# Patient Record
Sex: Male | Born: 1950 | Race: White | Hispanic: No | Marital: Single | Smoking: Current every day smoker
Health system: Southern US, Community
[De-identification: ages and names within clinical notes are randomized; demographics above are authoritative.]

## PROBLEM LIST (undated history)

## (undated) HISTORY — PX: APPENDECTOMY: SHX54

## (undated) HISTORY — PX: JOINT REPLACEMENT: SHX530

## (undated) HISTORY — PX: TOTAL HIP ARTHROPLASTY: SHX124

## (undated) MED FILL — Fosaprepitant Dimeglumine For IV Infusion 150 MG (Base Eq): INTRAVENOUS | Qty: 5 | Status: AC

---

## 2010-02-23 ENCOUNTER — Ambulatory Visit: Payer: Self-pay | Admitting: Internal Medicine

## 2020-06-22 ENCOUNTER — Inpatient Hospital Stay
Admission: EM | Admit: 2020-06-22 | Discharge: 2020-06-24 | DRG: 342 | Disposition: A | Discharge status: Home / Self Care | Payer: Medicare Other | Attending: General Surgery | Admitting: General Surgery

## 2020-06-22 ENCOUNTER — Other Ambulatory Visit: Payer: Self-pay

## 2020-06-22 ENCOUNTER — Ambulatory Visit
Admission: EM | Admit: 2020-06-22 | Discharge: 2020-06-22 | Disposition: A | Discharge status: Other Acute Inpatient Hospital | Payer: Medicare Other

## 2020-06-22 ENCOUNTER — Emergency Department: Payer: Medicare Other

## 2020-06-22 DIAGNOSIS — Z20822 Contact with and (suspected) exposure to covid-19: Secondary | ICD-10-CM | POA: Diagnosis present

## 2020-06-22 DIAGNOSIS — K3580 Unspecified acute appendicitis: Secondary | ICD-10-CM | POA: Diagnosis present

## 2020-06-22 DIAGNOSIS — Z96641 Presence of right artificial hip joint: Secondary | ICD-10-CM | POA: Diagnosis present

## 2020-06-22 DIAGNOSIS — G8929 Other chronic pain: Secondary | ICD-10-CM | POA: Diagnosis not present

## 2020-06-22 DIAGNOSIS — K353 Acute appendicitis with localized peritonitis, without perforation or gangrene: Secondary | ICD-10-CM | POA: Diagnosis not present

## 2020-06-22 DIAGNOSIS — K352 Acute appendicitis with generalized peritonitis, without abscess: Secondary | ICD-10-CM | POA: Diagnosis present

## 2020-06-22 DIAGNOSIS — N390 Urinary tract infection, site not specified: Secondary | ICD-10-CM

## 2020-06-22 DIAGNOSIS — N400 Enlarged prostate without lower urinary tract symptoms: Secondary | ICD-10-CM | POA: Diagnosis present

## 2020-06-22 DIAGNOSIS — R1031 Right lower quadrant pain: Secondary | ICD-10-CM | POA: Diagnosis present

## 2020-06-22 DIAGNOSIS — F17210 Nicotine dependence, cigarettes, uncomplicated: Secondary | ICD-10-CM | POA: Diagnosis present

## 2020-06-22 DIAGNOSIS — N136 Pyonephrosis: Secondary | ICD-10-CM | POA: Diagnosis present

## 2020-06-22 LAB — CBC
HCT: 43.5 % (ref 39.0–52.0)
Hemoglobin: 15.1 g/dL (ref 13.0–17.0)
MCH: 31.3 pg (ref 26.0–34.0)
MCHC: 34.7 g/dL (ref 30.0–36.0)
MCV: 90.2 fL (ref 80.0–100.0)
Platelets: 275 10*3/uL (ref 150–400)
RBC: 4.82 MIL/uL (ref 4.22–5.81)
RDW: 13.8 % (ref 11.5–15.5)
WBC: 16.1 10*3/uL — ABNORMAL HIGH (ref 4.0–10.5)
nRBC: 0 % (ref 0.0–0.2)

## 2020-06-22 LAB — COMPREHENSIVE METABOLIC PANEL
ALT: 18 U/L (ref 0–44)
AST: 19 U/L (ref 15–41)
Albumin: 4.1 g/dL (ref 3.5–5.0)
Alkaline Phosphatase: 54 U/L (ref 38–126)
Anion gap: 11 (ref 5–15)
BUN: 18 mg/dL (ref 8–23)
CO2: 19 mmol/L — ABNORMAL LOW (ref 22–32)
Calcium: 9.4 mg/dL (ref 8.9–10.3)
Chloride: 106 mmol/L (ref 98–111)
Creatinine, Ser: 0.9 mg/dL (ref 0.61–1.24)
GFR, Estimated: >60 mL/min (ref >60)
Glucose, Bld: 135 mg/dL — ABNORMAL HIGH (ref 70–99)
Potassium: 4.2 mmol/L (ref 3.5–5.1)
Sodium: 136 mmol/L (ref 135–145)
Total Bilirubin: 1 mg/dL (ref 0.3–1.2)
Total Protein: 7.4 g/dL (ref 6.5–8.1)

## 2020-06-22 LAB — URINALYSIS, COMPLETE (UACMP) WITH MICROSCOPIC
Bilirubin Urine: NEGATIVE
Glucose, UA: NEGATIVE mg/dL
Ketones, ur: 20 mg/dL — AB
Nitrite: POSITIVE — AB
Protein, ur: NEGATIVE mg/dL
Specific Gravity, Urine: 1.014 (ref 1.005–1.030)
WBC, UA: >50 WBC/hpf — ABNORMAL HIGH (ref 0–5)
pH: 5 (ref 5.0–8.0)

## 2020-06-22 LAB — RESP PANEL BY RT-PCR (FLU A&B, COVID) ARPGX2
Influenza A by PCR: NEGATIVE
Influenza B by PCR: NEGATIVE
SARS Coronavirus 2 by RT PCR: NEGATIVE

## 2020-06-22 LAB — LIPASE, BLOOD: Lipase: 17 U/L (ref 11–51)

## 2020-06-22 MED ORDER — ONDANSETRON HCL 4 MG/2ML IJ SOLN: 4.0000 mg | Freq: Four times a day (QID) | INTRAMUSCULAR | Status: DC | PRN

## 2020-06-22 MED ORDER — HYDROMORPHONE HCL 1 MG/ML IJ SOLN: 0.5000 mg | INTRAMUSCULAR | Status: DC | PRN

## 2020-06-22 MED ORDER — ACETAMINOPHEN 325 MG PO TABS: 650.0000 mg | ORAL_TABLET | Freq: Four times a day (QID) | ORAL | Status: DC | PRN

## 2020-06-22 MED ORDER — SODIUM CHLORIDE 0.9 % IV BOLUS
1000.0000 mL | Freq: Once | INTRAVENOUS | Status: AC
  Administered 2020-06-22: 1000 mL via INTRAVENOUS

## 2020-06-22 MED ORDER — MORPHINE SULFATE (PF) 4 MG/ML IV SOLN
4.0000 mg | Freq: Once | INTRAVENOUS | Status: AC
  Administered 2020-06-22: 4 mg via INTRAVENOUS
  Filled 2020-06-22: qty 1

## 2020-06-22 MED ORDER — PIPERACILLIN-TAZOBACTAM 3.375 G IVPB
3.3750 g | Freq: Three times a day (TID) | INTRAVENOUS | Status: DC
  Administered 2020-06-22 – 2020-06-23 (×3): 3.375 g via INTRAVENOUS
  Filled 2020-06-22 (×2): qty 50

## 2020-06-22 MED ORDER — SODIUM CHLORIDE 0.9 % IV SOLN: Freq: Once | INTRAVENOUS | Status: AC

## 2020-06-22 MED ORDER — DEXTROSE IN LACTATED RINGERS 5 % IV SOLN: INTRAVENOUS | Status: DC

## 2020-06-22 MED ORDER — KETOROLAC TROMETHAMINE 15 MG/ML IJ SOLN
15.0000 mg | Freq: Four times a day (QID) | INTRAMUSCULAR | Status: DC | PRN
  Filled 2020-06-22: qty 1

## 2020-06-22 MED ORDER — ONDANSETRON HCL 4 MG/2ML IJ SOLN
4.0000 mg | Freq: Once | INTRAMUSCULAR | Status: AC
  Administered 2020-06-22: 4 mg via INTRAVENOUS
  Filled 2020-06-22: qty 2

## 2020-06-22 MED ORDER — MUPIROCIN 2 % EX OINT
1.0000 "application " | TOPICAL_OINTMENT | Freq: Two times a day (BID) | CUTANEOUS | Status: DC
  Administered 2020-06-23: 1 via NASAL
  Filled 2020-06-22: qty 22

## 2020-06-22 MED ORDER — SODIUM CHLORIDE 0.9 % IV SOLN
1.0000 g | Freq: Once | INTRAVENOUS | Status: AC
  Administered 2020-06-22: 1 g via INTRAVENOUS
  Filled 2020-06-22: qty 10

## 2020-06-22 NOTE — ED Notes (Signed)
ED Provider at bedside. 

## 2020-06-22 NOTE — ED Notes (Signed)
Patient is being discharged from the Urgent Care and sent to the Emergency Department via POV . Per Becky Augusta NP, patient is in need of higher level of care due to current symptoms. Patient is aware and verbalizes understanding of plan of care.  Vitals:   06/22/20 1247  BP: (!) 166/78  Pulse: (!) 122  Resp: 17  Temp: 99.2 F (37.3 C)  SpO2: 94%

## 2020-06-22 NOTE — ED Notes (Signed)
Last Oral Intake.   Per pt he stated last time he ate or drank anything was yesterday

## 2020-06-22 NOTE — ED Notes (Signed)
MD notified patient complaining of pain 8/10. No pain meds ordered. Awaiting orders.

## 2020-06-22 NOTE — ED Provider Notes (Signed)
[facility] Emergency Department Provider Note    ____________________________________________   I have reviewed the triage vital signs and the nursing notes.   HISTORY  Chief Complaint Abdominal Pain   History limited by: Not Limited   HPI Gary Wilkins is a 70 y.o. male who presents to the emergency department today from urgent care because of concerns for abdominal pain.  The patient states that the pain started early this morning.  It is located in the right lower quadrant.  It has progressively gotten worse.  Patient denies any unusual activity or ingestions yesterday.  Denies similar pain in the past.  He denies any change in urination.  Did think he might of had a brief fever earlier today.  Denies any history of abdominal surgeries.  Records reviewed. Per medical record review patient has a history of right hip arthoplasty.  History reviewed. No pertinent past medical history.   Past Surgical History:  Procedure Laterality Date   TOTAL HIP ARTHROPLASTY Right     Prior to Admission medications   Not on File    Allergies Patient has no known allergies.  Family History  Problem Relation Age of Onset   Alzheimer's disease Mother    Stroke Father     Social History Social History   Tobacco Use   Smoking status: Every Day    Packs/day: 1.00    Pack years: 0.00    Types: Cigarettes   Smokeless tobacco: Never  Vaping Use   Vaping Use: Never used  Substance Use Topics   Alcohol use: Yes    Alcohol/week: 7.0 standard drinks    Types: 7 Shots of liquor per week   Drug use: Not Currently    Review of Systems Constitutional: Positive for brief fever.  Eyes: No visual changes. ENT: No sore throat. Cardiovascular: Denies chest pain. Respiratory: Denies shortness of breath. Gastrointestinal: Positive for abdominal pain.  Genitourinary: Negative for dysuria. Musculoskeletal: Negative for back pain. Skin: Negative for  rash. Neurological: Negative for headaches, focal weakness or numbness.  ____________________________________________   PHYSICAL EXAM:  VITAL SIGNS: ED Triage Vitals  Enc Vitals Group     BP 06/22/20 1341 (!) 166/78     Pulse Rate 06/22/20 1341 (!) 112     Resp 06/22/20 1341 18     Temp 06/22/20 1341 99.3 F (37.4 C)     Temp Source 06/22/20 1341 Oral     SpO2 06/22/20 1341 97 %     Weight 06/22/20 1336 140 lb (63.5 kg)     Height 06/22/20 1336 5\' 10"  (1.778 m)     Head Circumference --      Peak Flow --      Pain Score 06/22/20 1535 1    Constitutional: Alert and oriented.  Eyes: Conjunctivae are normal.  ENT      Head: Normocephalic and atraumatic.      Nose: No congestion/rhinnorhea.      Mouth/Throat: Mucous membranes are moist.      Neck: No stridor. Hematological/Lymphatic/Immunilogical: No cervical lymphadenopathy. Cardiovascular: Normal rate, regular rhythm.  No murmurs, rubs, or gallops.  Respiratory: Normal respiratory effort without tachypnea nor retractions. Breath sounds are clear and equal bilaterally. No wheezes/rales/rhonchi. Gastrointestinal: Soft tender to palpation in the right lower quadrant.  Genitourinary: Deferred Musculoskeletal: Normal range of motion in all extremities. No lower extremity edema. Neurologic:  Normal speech and language. No gross focal neurologic deficits are appreciated.  Skin:  Skin is warm, dry and intact. No rash  noted. Psychiatric: Mood and affect are normal. Speech and behavior are normal. Patient exhibits appropriate insight and judgment.  ____________________________________________    LABS (pertinent positives/negatives)  CMP wnl except co2 19, glu 135 UA cloudy, moderate hgb dipstick, large leukocytes, positive nitrite, 11-20 RBC, >50 WBC, many bacteria Lipase 17 CBC wbc 16.1, hgb 15.1, plt 275  ____________________________________________   EKG  None  ____________________________________________     RADIOLOGY  CT abd/pel Acute appendicitis  ____________________________________________   PROCEDURES  Procedures  ____________________________________________   INITIAL IMPRESSION / ASSESSMENT AND PLAN / ED COURSE  Pertinent labs & imaging results that were available during my care of the patient were reviewed by me and considered in my medical decision making (see chart for details).   Patient presented to the emergency department today with complaints of right lower quadrant abdominal pain.  On exam he is tender in the right lower quadrant.  Work-up does show slight leukocytosis as well as urine consistent with urinary tract infection.  Did obtain CT scan which is consistent with acute appendicitis.  Discussed findings with patient.  Will plan on admission.   ____________________________________________   FINAL CLINICAL IMPRESSION(S) / ED DIAGNOSES  Final diagnoses:  Acute appendicitis, unspecified acute appendicitis type  Lower urinary tract infectious disease     Note: This dictation was prepared with Dragon dictation. Any transcriptional errors that result from this process are unintentional     [name], MD 06/22/20 1659

## 2020-06-22 NOTE — ED Provider Notes (Signed)
[facility]    CSN: [id number] Arrival date & time: 06/22/20  1231      History   Chief Complaint Chief Complaint  Patient presents with   Abdominal Pain    HPI Gary Wilkins is a 70 y.o. male.   HPI  70 year old male here for evaluation of right lower quad abdominal pain.  Patient reports that he had a sudden onset of right lower quadrant abdominal pain that he describes as a dull continuous ache at 0400 this morning.  He initially attributed this to possible constipation but states that did not resolve when he had a bowel movement.  Patient reports that the pain increases with movement, he has had a decreased appetite, and a fever.  He denies nausea vomiting, or blood in his stool.  Patient reports that he had a normal bowel movement which was followed by some diarrhea.  History reviewed. No pertinent past medical history.  There are no problems to display for this patient.   Past Surgical History:  Procedure Laterality Date   TOTAL HIP ARTHROPLASTY Right        Home Medications    Prior to Admission medications   Not on File    Family History Family History  Problem Relation Age of Onset   Alzheimer's disease Mother    Stroke Father     Social History Social History   Tobacco Use   Smoking status: Every Day    Packs/day: 1.00    Pack years: 0.00    Types: Cigarettes   Smokeless tobacco: Never  Vaping Use   Vaping Use: Never used  Substance Use Topics   Alcohol use: Yes    Alcohol/week: 7.0 standard drinks    Types: 7 Shots of liquor per week   Drug use: Not Currently     Allergies   Patient has no known allergies.   Review of Systems Review of Systems  Constitutional:  Positive for appetite change and fever.  Gastrointestinal:  Positive for abdominal pain. Negative for blood in stool, constipation, nausea and vomiting.  Hematological: Negative.   Psychiatric/Behavioral: Negative.      Physical Exam Triage Vital Signs ED  Triage Vitals [06/22/20 1243]  Enc Vitals Group     BP      Pulse      Resp      Temp      Temp src      SpO2      Weight 140 lb (63.5 kg)     Height 5\' 10"  (1.778 m)     Head Circumference      Peak Flow      Pain Score 4     Pain Loc      Pain Edu?      Excl. in GC?    No data found.  Updated Vital Signs BP (!) 166/78 (BP Location: Right Arm)   Pulse (!) 122   Temp 99.2 F (37.3 C) (Oral)   Resp 17   Ht 5\' 10"  (1.778 m)   Wt 140 lb (63.5 kg)   SpO2 94%   BMI 20.09 kg/m   Visual Acuity Right Eye Distance:   Left Eye Distance:   Bilateral Distance:    Right Eye Near:   Left Eye Near:    Bilateral Near:     Physical Exam Vitals and nursing note reviewed.  Constitutional:      General: He is not in acute distress.    Appearance: He is well-developed and  normal weight. He is not ill-appearing.  HENT:     Head: Normocephalic and atraumatic.  Cardiovascular:     Rate and Rhythm: Normal rate and regular rhythm.     Heart sounds: Normal heart sounds. No murmur heard.   No gallop.  Pulmonary:     Effort: Pulmonary effort is normal.     Breath sounds: Normal breath sounds. No wheezing, rhonchi or rales.  Abdominal:     General: Abdomen is flat. Bowel sounds are normal. There is no distension.     Palpations: Abdomen is soft.     Tenderness: There is abdominal tenderness in the right lower quadrant. There is guarding. Positive signs include McBurney's sign.  Skin:    General: Skin is warm.     Capillary Refill: Capillary refill takes less than 2 seconds.     Findings: No erythema or rash.  Neurological:     General: No focal deficit present.     Mental Status: He is alert and oriented to person, place, and time.  Psychiatric:        Mood and Affect: Mood normal.        Behavior: Behavior normal.     UC Treatments / Results  Labs (all labs ordered are listed, but only abnormal results are displayed) Labs Reviewed - No data to  display  EKG   Radiology No results found.  Procedures Procedures (including critical care time)  Medications Ordered in UC Medications - No data to display  Initial Impression / Assessment and Plan / UC Course  I have reviewed the triage vital signs and the nursing notes.  Pertinent labs & imaging results that were available during my care of the patient were reviewed by me and considered in my medical decision making (see chart for details).  Patient is a very pleasant, nontoxic-appearing 70 year old male who looks to be in pain here for evaluation of right lower quadrant pain as described in the HPI above.  Patient's physical exam reveals a benign cardiopulmonary exam.  Abdomen is flat, with positive bowel sounds in all 4 quadrants.  There is guarding in the abdomen.  Patient has tenderness in the right lower quadrant with focal tenderness over McBurney's point.  No rebound.  Abdominal guarding with right lower quadrant pain over majorities point that is in conjunction with fever and anorexia is concerning for appendicitis.  Discussed with patient that he needs to be evaluated in the emergency department and he is elected to go to [facility] via POV for eval of his right lower quadrant abdominal pain.   Final Clinical Impressions(s) / UC Diagnoses   Final diagnoses:  Abdominal pain, chronic, right lower quadrant     Discharge Instructions      Please go to the emergency department at [facility] ER for evaluation of a right lower quadrant abdominal pain as I am concerned that she may have appendicitis.     ED Prescriptions   None    PDMP not reviewed this encounter.   Becky Augusta, NP 06/22/20 1301

## 2020-06-22 NOTE — ED Notes (Signed)
Patient transported to CT 

## 2020-06-22 NOTE — ED Notes (Signed)
Patient transported to Ultrasound 

## 2020-06-22 NOTE — ED Triage Notes (Signed)
Patient complains of right lower quadrant pain with sudden onset around 3am. States that that walking or movement makes the pain worse.

## 2020-06-22 NOTE — ED Triage Notes (Signed)
Pt sent from the [facility] urgent care with RLQ pain that started this morning. Pt is ambulatory with no distress noted at this time

## 2020-06-22 NOTE — Discharge Instructions (Addendum)
Please go to the emergency department at [facility] ER for evaluation of a right lower quadrant abdominal pain as I am concerned that she may have appendicitis.

## 2020-06-23 ENCOUNTER — Inpatient Hospital Stay: Payer: Medicare Other | Admitting: Anesthesiology

## 2020-06-23 ENCOUNTER — Encounter
Admission: EM | Disposition: A | Discharge status: Home / Self Care | Payer: Self-pay | Source: Home / Self Care | Attending: General Surgery

## 2020-06-23 ENCOUNTER — Encounter: Payer: Self-pay | Admitting: General Surgery

## 2020-06-23 DIAGNOSIS — K3580 Unspecified acute appendicitis: Secondary | ICD-10-CM

## 2020-06-23 DIAGNOSIS — K353 Acute appendicitis with localized peritonitis, without perforation or gangrene: Secondary | ICD-10-CM

## 2020-06-23 HISTORY — PX: LAPAROSCOPIC APPENDECTOMY: SHX408

## 2020-06-23 LAB — SURGICAL PCR SCREEN
MRSA, PCR: NEGATIVE
Staphylococcus aureus: POSITIVE — AB

## 2020-06-23 SURGERY — APPENDECTOMY, LAPAROSCOPIC
Anesthesia: General

## 2020-06-23 MED ORDER — SULFAMETHOXAZOLE-TRIMETHOPRIM 800-160 MG PO TABS
1.0000 | ORAL_TABLET | Freq: Two times a day (BID) | ORAL | Status: DC
  Administered 2020-06-23 – 2020-06-24 (×2): 1 via ORAL
  Filled 2020-06-23 (×3): qty 1

## 2020-06-23 MED ORDER — MIDAZOLAM HCL 2 MG/2ML IJ SOLN
INTRAMUSCULAR | Status: AC
  Filled 2020-06-23: qty 2

## 2020-06-23 MED ORDER — PROPOFOL 10 MG/ML IV BOLUS
INTRAVENOUS | Status: AC
  Filled 2020-06-23: qty 20

## 2020-06-23 MED ORDER — LACTATED RINGERS IV SOLN: INTRAVENOUS | Status: DC

## 2020-06-23 MED ORDER — LACTATED RINGERS IV SOLN: INTRAVENOUS | Status: DC | PRN

## 2020-06-23 MED ORDER — FENTANYL CITRATE (PF) 100 MCG/2ML IJ SOLN
INTRAMUSCULAR | Status: AC
  Filled 2020-06-23: qty 2

## 2020-06-23 MED ORDER — HYDROCODONE-ACETAMINOPHEN 7.5-325 MG PO TABS: 1.0000 | ORAL_TABLET | Freq: Once | ORAL | Status: DC | PRN

## 2020-06-23 MED ORDER — PIPERACILLIN-TAZOBACTAM 3.375 G IVPB
INTRAVENOUS | Status: AC
  Filled 2020-06-23: qty 50

## 2020-06-23 MED ORDER — ONDANSETRON HCL 4 MG/2ML IJ SOLN
INTRAMUSCULAR | Status: DC | PRN
  Administered 2020-06-23: 4 mg via INTRAVENOUS

## 2020-06-23 MED ORDER — FENTANYL CITRATE (PF) 100 MCG/2ML IJ SOLN: 25.0000 ug | INTRAMUSCULAR | Status: DC | PRN

## 2020-06-23 MED ORDER — SUCCINYLCHOLINE CHLORIDE 200 MG/10ML IV SOSY
PREFILLED_SYRINGE | INTRAVENOUS | Status: AC
  Filled 2020-06-23: qty 10

## 2020-06-23 MED ORDER — BUPIVACAINE HCL (PF) 0.25 % IJ SOLN
INTRAMUSCULAR | Status: AC
  Filled 2020-06-23: qty 30

## 2020-06-23 MED ORDER — MEPERIDINE HCL 25 MG/ML IJ SOLN: 6.2500 mg | INTRAMUSCULAR | Status: DC | PRN

## 2020-06-23 MED ORDER — OXYCODONE HCL 5 MG PO TABS
5.0000 mg | ORAL_TABLET | ORAL | Status: DC | PRN
Start: 2020-06-23 — End: 2020-06-24

## 2020-06-23 MED ORDER — ROCURONIUM BROMIDE 10 MG/ML (PF) SYRINGE
PREFILLED_SYRINGE | INTRAVENOUS | Status: AC
  Filled 2020-06-23: qty 10

## 2020-06-23 MED ORDER — DEXAMETHASONE SODIUM PHOSPHATE 10 MG/ML IJ SOLN
INTRAMUSCULAR | Status: DC | PRN
  Administered 2020-06-23: 10 mg via INTRAVENOUS

## 2020-06-23 MED ORDER — LIDOCAINE-EPINEPHRINE 1 %-1:100000 IJ SOLN
INTRAMUSCULAR | Status: AC
  Filled 2020-06-23: qty 1

## 2020-06-23 MED ORDER — DEXAMETHASONE SODIUM PHOSPHATE 10 MG/ML IJ SOLN
INTRAMUSCULAR | Status: AC
  Filled 2020-06-23: qty 1

## 2020-06-23 MED ORDER — PROPOFOL 10 MG/ML IV BOLUS
INTRAVENOUS | Status: DC | PRN
  Administered 2020-06-23: 160 mg via INTRAVENOUS

## 2020-06-23 MED ORDER — ACETAMINOPHEN 10 MG/ML IV SOLN
INTRAVENOUS | Status: DC | PRN
  Administered 2020-06-23: 1000 mg via INTRAVENOUS

## 2020-06-23 MED ORDER — MIDAZOLAM HCL 2 MG/2ML IJ SOLN
INTRAMUSCULAR | Status: DC | PRN
  Administered 2020-06-23: 2 mg via INTRAVENOUS

## 2020-06-23 MED ORDER — ONDANSETRON HCL 4 MG/2ML IJ SOLN: 4.0000 mg | Freq: Once | INTRAMUSCULAR | Status: AC | PRN

## 2020-06-23 MED ORDER — ONDANSETRON HCL 4 MG/2ML IJ SOLN
INTRAMUSCULAR | Status: AC
  Administered 2020-06-23: 4 mg via INTRAVENOUS
  Filled 2020-06-23: qty 2

## 2020-06-23 MED ORDER — FENTANYL CITRATE (PF) 100 MCG/2ML IJ SOLN
INTRAMUSCULAR | Status: DC | PRN
  Administered 2020-06-23: 25 ug via INTRAVENOUS
  Administered 2020-06-23: 100 ug via INTRAVENOUS
  Administered 2020-06-23: 50 ug via INTRAVENOUS

## 2020-06-23 MED ORDER — SUCCINYLCHOLINE CHLORIDE 20 MG/ML IJ SOLN
INTRAMUSCULAR | Status: DC | PRN
  Administered 2020-06-23: 120 mg via INTRAVENOUS

## 2020-06-23 MED ORDER — LIDOCAINE HCL (CARDIAC) PF 100 MG/5ML IV SOSY
PREFILLED_SYRINGE | INTRAVENOUS | Status: DC | PRN
  Administered 2020-06-23: 100 mg via INTRAVENOUS

## 2020-06-23 MED ORDER — SUGAMMADEX SODIUM 200 MG/2ML IV SOLN
INTRAVENOUS | Status: DC | PRN
  Administered 2020-06-23 (×3): 50 mg via INTRAVENOUS

## 2020-06-23 MED ORDER — ROCURONIUM BROMIDE 100 MG/10ML IV SOLN
INTRAVENOUS | Status: DC | PRN
  Administered 2020-06-23: 30 mg via INTRAVENOUS

## 2020-06-23 MED ORDER — LIDOCAINE-EPINEPHRINE 1 %-1:100000 IJ SOLN
INTRAMUSCULAR | Status: DC | PRN
  Administered 2020-06-23: 15 mL via INTRAMUSCULAR

## 2020-06-23 MED ORDER — ONDANSETRON HCL 4 MG/2ML IJ SOLN
INTRAMUSCULAR | Status: AC
  Filled 2020-06-23: qty 2

## 2020-06-23 MED ORDER — ACETAMINOPHEN 10 MG/ML IV SOLN
INTRAVENOUS | Status: AC
  Filled 2020-06-23: qty 100

## 2020-06-23 MED ORDER — PHENYLEPHRINE HCL (PRESSORS) 10 MG/ML IV SOLN
INTRAVENOUS | Status: DC | PRN
  Administered 2020-06-23: 100 ug via INTRAVENOUS
  Administered 2020-06-23: 50 ug via INTRAVENOUS

## 2020-06-23 SURGICAL SUPPLY — 53 items
APPLICATOR COTTON TIP 6 STRL (MISCELLANEOUS) ×1 IMPLANT
APPLICATOR COTTON TIP 6IN STRL (MISCELLANEOUS) ×2
APPLIER CLIP 5 13 M/L LIGAMAX5 (MISCELLANEOUS) ×2
BLADE CLIPPER SURG (BLADE) ×2 IMPLANT
BLADE SURG SZ11 CARB STEEL (BLADE) ×2 IMPLANT
CANISTER SUCT 1200ML W/VALVE (MISCELLANEOUS) ×2 IMPLANT
CHLORAPREP W/TINT 26 (MISCELLANEOUS) ×2 IMPLANT
CLIP APPLIE 5 13 M/L LIGAMAX5 (MISCELLANEOUS) ×1 IMPLANT
COVER WAND RF STERILE (DRAPES) ×2 IMPLANT
CUTTER FLEX LINEAR 45M (STAPLE) ×2 IMPLANT
DEFOGGER SCOPE WARMER CLEARIFY (MISCELLANEOUS) ×2 IMPLANT
DERMABOND ADVANCED (GAUZE/BANDAGES/DRESSINGS) ×1
DERMABOND ADVANCED .7 DNX12 (GAUZE/BANDAGES/DRESSINGS) ×1 IMPLANT
ELECT CAUTERY BLADE TIP 2.5 (TIP) ×2
ELECT REM PT RETURN 9FT ADLT (ELECTROSURGICAL) ×2
ELECTRODE CAUTERY BLDE TIP 2.5 (TIP) ×1 IMPLANT
ELECTRODE REM PT RTRN 9FT ADLT (ELECTROSURGICAL) ×1 IMPLANT
GAUZE 4X4 16PLY RFD (DISPOSABLE) ×2 IMPLANT
GLOVE SURG ENC MOIS LTX SZ6.5 (GLOVE) ×2 IMPLANT
GLOVE SURG UNDER LTX SZ7 (GLOVE) ×2 IMPLANT
GOWN STRL REUS W/ TWL LRG LVL3 (GOWN DISPOSABLE) ×2 IMPLANT
GOWN STRL REUS W/TWL LRG LVL3 (GOWN DISPOSABLE) ×2
GRASPER SUT TROCAR 14GX15 (MISCELLANEOUS) ×2 IMPLANT
IRRIGATION STRYKERFLOW (MISCELLANEOUS) ×1 IMPLANT
IRRIGATOR STRYKERFLOW (MISCELLANEOUS) ×2
IV NS 1000ML (IV SOLUTION) ×1
IV NS 1000ML BAXH (IV SOLUTION) ×1 IMPLANT
KIT TURNOVER KIT A (KITS) ×2 IMPLANT
KITTNER LAPARASCOPIC 5X40 (MISCELLANEOUS) ×2 IMPLANT
LABEL OR SOLS (LABEL) ×2 IMPLANT
MANIFOLD NEPTUNE II (INSTRUMENTS) ×2 IMPLANT
NEEDLE HYPO 22GX1.5 SAFETY (NEEDLE) ×2 IMPLANT
NS IRRIG 500ML POUR BTL (IV SOLUTION) ×2 IMPLANT
PACK LAP CHOLECYSTECTOMY (MISCELLANEOUS) ×2 IMPLANT
PENCIL ELECTRO HAND CTR (MISCELLANEOUS) ×2 IMPLANT
POUCH SPECIMEN RETRIEVAL 10MM (ENDOMECHANICALS) ×2 IMPLANT
RELOAD STAPLE TA45 3.5 REG BLU (ENDOMECHANICALS) ×2 IMPLANT
SCISSORS METZENBAUM CVD 33 (INSTRUMENTS) ×2 IMPLANT
SET TUBE SMOKE EVAC HIGH FLOW (TUBING) ×2 IMPLANT
SHEARS HARMONIC ACE PLUS 36CM (ENDOMECHANICALS) ×2 IMPLANT
SLEEVE ADV FIXATION 5X100MM (TROCAR) ×2 IMPLANT
STRIP CLOSURE SKIN 1/2X4 (GAUZE/BANDAGES/DRESSINGS) ×2 IMPLANT
SUT MNCRL 4-0 (SUTURE) ×1
SUT MNCRL 4-0 27XMFL (SUTURE) ×1
SUT VIC AB 3-0 SH 27 (SUTURE) ×1
SUT VIC AB 3-0 SH 27X BRD (SUTURE) ×1 IMPLANT
SUT VICRYL 0 AB UR-6 (SUTURE) ×2 IMPLANT
SUTURE MNCRL 4-0 27XMF (SUTURE) ×1 IMPLANT
SYS KII FIOS ACCESS ABD 5X100 (TROCAR) ×2
SYSTEM KII FIOS ACES ABD 5X100 (TROCAR) ×1 IMPLANT
TRAY FOLEY MTR SLVR 16FR STAT (SET/KITS/TRAYS/PACK) ×2 IMPLANT
TROCAR ADV FIXATION 12X100MM (TROCAR) IMPLANT
TROCAR BALLN GELPORT 12X130M (ENDOMECHANICALS) ×2 IMPLANT

## 2020-06-23 NOTE — Anesthesia Procedure Notes (Signed)
Procedure Name: Intubation Date/Time: 06/23/2020 2:44 PM Performed by: Renee Ramus, CRNA Pre-anesthesia Checklist: Patient identified, Emergency Drugs available, Suction available and Patient being monitored Patient Re-evaluated:Patient Re-evaluated prior to induction Oxygen Delivery Method: Circle system utilized Preoxygenation: Pre-oxygenation with 100% oxygen Induction Type: IV induction Ventilation: Mask ventilation without difficulty Laryngoscope Size: McGraph and 3 Grade View: Grade I Tube type: Oral Tube size: 7.5 mm Number of attempts: 1 Airway Equipment and Method: Stylet and Oral airway Placement Confirmation: ETT inserted through vocal cords under direct vision, positive ETCO2 and breath sounds checked- equal and bilateral Secured at: 22 cm Tube secured with: Tape Dental Injury: Teeth and Oropharynx as per pre-operative assessment

## 2020-06-23 NOTE — Transfer of Care (Signed)
Immediate Anesthesia Transfer of Care Note  Patient: Gary Wilkins  Procedure(s) Performed: APPENDECTOMY LAPAROSCOPIC  Patient Location: PACU  Anesthesia Type:General  Level of Consciousness: drowsy  Airway & Oxygen Therapy: Patient Spontanous Breathing and Patient connected to face mask oxygen  Post-op Assessment: Report given to RN, Post -op Vital signs reviewed and stable and Patient moving all extremities  Post vital signs: Reviewed and stable  Last Vitals:  Vitals Value Taken Time  BP 148/58 06/23/20 1610  Temp 36.3 C 06/23/20 1610  Pulse 91 06/23/20 1611  Resp 21 06/23/20 1611  SpO2 100 % 06/23/20 1611  Vitals shown include unvalidated device data.  Last Pain:  Vitals:   06/23/20 1610  TempSrc: Tympanic  PainSc: 0-No pain         Complications: No notable events documented.

## 2020-06-23 NOTE — H&P (Addendum)
[facility] SURGICAL HISTORY & PHYSICAL (cpt [id number])  HISTORY OF PRESENT ILLNESS (HPI):  70 y.o. male presented to [facility] ED yesterday for abdominal pain. Patient reports the sudden onset of abdominal pain around 0400 the day of presentation. This was located in his RLQ. He initially thought this was constipation, but the pain was not relieved with having a BM. The pain persisted throughout the day without relief. He reports associated nausea. No fever, chills, cough, CP, SOB, emesis, urinary changes, or bowel changes. No history of similar. No previous intra-abdominal surgery. He initially presented to UC for this but was referred to the ED for work up. Work up in the ED revealed a leukocytosis to 16.1K but was otherwise reassuring. CT Abdomen/Pelvis was concerning for acute uncomplicated appendicitis.   General surgery is consulted by emergency medicine physician Dr Olga Coaster, MD for evaluation and management of acute uncomplicated appendicitis   PAST MEDICAL HISTORY (PMH):  History reviewed. No pertinent past medical history.  Reviewed. Otherwise negative.   PAST SURGICAL HISTORY (PSH):  Past Surgical History:  Procedure Laterality Date   TOTAL HIP ARTHROPLASTY Right     Reviewed. Otherwise negative.   MEDICATIONS:  Prior to Admission medications   Not on File     ALLERGIES:  No Known Allergies   SOCIAL HISTORY:  Social History   Socioeconomic History   Marital status: Single    Spouse name: Not on file   Number of children: Not on file   Years of education: Not on file   Highest education level: Not on file  Occupational History   Not on file  Tobacco Use   Smoking status: Every Day    Packs/day: 1.00    Pack years: 0.00    Types: Cigarettes   Smokeless tobacco: Never  Vaping Use   Vaping Use: Never used  Substance and Sexual Activity   Alcohol use: Yes    Alcohol/week: 5.0 standard drinks    Types: 5 Shots of liquor per week   Drug use: Not  Currently   Sexual activity: Not on file  Other Topics Concern   Not on file  Social History Narrative   Not on file   Social Determinants of Health   Financial Resource Strain: Not on file  Food Insecurity: Not on file  Transportation Needs: Not on file  Physical Activity: Not on file  Stress: Not on file  Social Connections: Not on file  Intimate Partner Violence: Not on file     FAMILY HISTORY:  Family History  Problem Relation Age of Onset   Alzheimer's disease Mother    Stroke Father     Otherwise negative.   REVIEW OF SYSTEMS:  Review of Systems  Constitutional:  Negative for chills and fever.  HENT:  Negative for congestion and sore throat.   Respiratory:  Negative for cough and shortness of breath.   Cardiovascular:  Negative for chest pain and palpitations.  Gastrointestinal:  Positive for abdominal pain and nausea. Negative for constipation, diarrhea and vomiting.  Genitourinary:  Negative for dysuria and urgency.  All other systems reviewed and are negative.  VITAL SIGNS:  Temp:  [98 F (36.7 C)-99.5 F (37.5 C)] 99.5 F (37.5 C) (06/10 0516) Pulse Rate:  [80-122] 86 (06/10 0516) Resp:  [16-19] 18 (06/10 0516) BP: (125-175)/(61-101) 125/61 (06/10 0516) SpO2:  [93 %-100 %] 93 % (06/10 0516) Weight:  [63.5 kg] 63.5 kg (06/09 1336)     Height: 5\' 10"  (177.8 cm) Weight: 63.5  kg BMI (Calculated): 20.09   PHYSICAL EXAM:  Physical Exam Vitals and nursing note reviewed. Exam conducted with a chaperone present.  Constitutional:      General: He is not in acute distress.    Appearance: He is well-developed and normal weight. He is not ill-appearing.  HENT:     Head: Normocephalic and atraumatic.  Eyes:     General: No scleral icterus.    Extraocular Movements: Extraocular movements intact.  Cardiovascular:     Rate and Rhythm: Normal rate.     Heart sounds: Normal heart sounds.  Pulmonary:     Effort: Pulmonary effort is normal. No respiratory distress.   Abdominal:     General: Abdomen is flat. There is no distension.     Palpations: Abdomen is soft.     Tenderness: There is abdominal tenderness in the right lower quadrant. There is no guarding or rebound. Positive signs include Rovsing's sign and McBurney's sign.  Genitourinary:    Comments: Deferred  Skin:    General: Skin is warm and dry.     Coloration: Skin is not jaundiced or pale.  Neurological:     General: No focal deficit present.     Mental Status: He is alert and oriented to person, place, and time.  Psychiatric:        Mood and Affect: Mood normal.        Behavior: Behavior normal.    INTAKE/OUTPUT:  This shift: No intake/output data recorded.  Last 2 shifts: @IOLAST2SHIFTS @  Labs:  CBC Latest Ref Rng & Units 06/22/2020  WBC 4.0 - 10.5 K/uL 16.1(H)  Hemoglobin 13.0 - 17.0 g/dL 08/22/2020  Hematocrit 11.9 - 52.0 % 43.5  Platelets 150 - 400 K/uL 275   CMP Latest Ref Rng & Units 06/22/2020  Glucose 70 - 99 mg/dL 08/22/2020)  BUN 8 - 23 mg/dL 18  Creatinine 829(F - 6.21 mg/dL 3.08  Sodium 6.57 - 846 mmol/L 136  Potassium 3.5 - 5.1 mmol/L 4.2  Chloride 98 - 111 mmol/L 106  CO2 22 - 32 mmol/L 19(L)  Calcium 8.9 - 10.3 mg/dL 9.4  Total Protein 6.5 - 8.1 g/dL 7.4  Total Bilirubin 0.3 - 1.2 mg/dL 1.0  Alkaline Phos 38 - 126 U/L 54  AST 15 - 41 U/L 19  ALT 0 - 44 U/L 18    Imaging studies:   CT Abdomen/Pelvis (06/22/2020) personally reviewed showing stranding surrounding the appendix without abscess or free air, and radiologist report reviewed:  IMPRESSION: Findings consistent with acute appendicitis. No definite abscess is noted.   Right nephrolithiasis is noted with moderate right hydronephrosis, but no ureteral dilatation or obstructing calculus is noted. 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   Mild prostatic enlargement.   Aortic Atherosclerosis (ICD10-I70.0).   Assessment/Plan: (ICD-10's: K35.80) 70 y.o. male with abdominal pain and leukocytosis  found to have acute uncomplicated appendicitis.    - Admit to general surgery  - Will plan on laparoscopic appendectomy this afternoon with Dr 62 pending OR/Anesthesia availability   - All risks, benefits, and alternatives to above procedure(s) were discussed with the patient, all of his questions were answered to his expressed satisfaction, patient expresses he wishes to proceed, and informed consent was obtained.   - NPO + IVF Resuscitation - IV Abx (Zosyn) - Monitor abdominal examination; on-going bowel function   - Pain control prn; emetics prn   - DVT prophylaxis  All of the above findings and recommendations were discussed with the patient and all  of his questions were answered to his expressed satisfaction.  -- Lynden Oxford, PA-C [facility] 06/23/2020, 7:09 AM [phone] M-F: 7am - 4pm  I saw and evaluated the patient.  I agree with the above documentation, exam, and plan, which I have edited where appropriate. Duanne Guess  1:14 PM

## 2020-06-23 NOTE — Discharge Instructions (Signed)
In addition to included general post-operative instructions,  Diet: Resume home diet.   Activity: No heavy lifting >20 pounds (children, pets, laundry, garbage) for 4 weeks, but light activity and walking are encouraged. Do not drive or drink alcohol if taking narcotic pain medications or having pain that might distract from driving.  Wound care: 2 days after surgery (06/12), you may shower/get incision wet with soapy water and pat dry (do not rub incisions), but no baths or submerging incision underwater until follow-up.   Medications: Resume all home medications. For mild to moderate pain: acetaminophen (Tylenol) or ibuprofen/naproxen (if no kidney disease). Combining Tylenol with alcohol can substantially increase your risk of causing liver disease. Narcotic pain medications, if prescribed, can be used for severe pain, though may cause nausea, constipation, and drowsiness. Do not combine Tylenol and Percocet (or similar) within a 6 hour period as Percocet (and similar) contain(s) Tylenol. If you do not need the narcotic pain medication, you do not need to fill the prescription.  Call office [phone] / [phone]) at any time if any questions, worsening pain, fevers/chills, bleeding, drainage from incision site, or other concerns.

## 2020-06-23 NOTE — Op Note (Signed)
Operative Note  Laparoscopic Appendectomy   [name] Date of operation:  06/23/2020  Indications: The patient presented with a history of  abdominal pain. Workup has revealed findings consistent with acute appendicitis.  Pre-operative Diagnosis: Acute appendicitis with generalized peritonitis  Post-operative Diagnosis: Same  Surgeon: Duanne Guess, MD  Anesthesia: GETA  Findings: Inflamed appendix with generalized peritonitis, no perforation or abscess  Estimated Blood Loss: <5cc         Specimens: appendix         Complications:  none immediately apparent.  Procedure Details  The patient was seen again in the preop area. The options of surgery versus observation were reviewed with the patient and/or family. The risks of bleeding, infection, recurrence of symptoms, negative laparoscopy, potential for an open procedure, bowel injury, abscess or infection, were all reviewed as well. The patient was taken to Operating Room, identified as Gary Wilkins and the procedure verified as laparoscopic appendectomy. A time out was performed and the above information confirmed.  The patient was placed in the supine position and general anesthesia was induced.  Antibiotic prophylaxis was administered and VTE prophylaxis was in place. A Foley catheter was placed by the nursing staff.   The abdomen was prepped and draped in a sterile fashion. A supraumbilical incision was made. A cutdown technique was used to enter the abdominal cavity. Two vicryl stitches were placed on the fascia and a Hasson trocar inserted. Pneumoperitoneum obtained. Two 5 mm ports were placed under direct visualization.  The appendix was identified and found to be acutely inflamed and stuck to the terminal ileum.  No abscess or perforation, but the surrounding bowel and peritoneum was red and appeared inflamed. The appendix was carefully dissected away from the surrounding tissues The mesoappendix was divided with the  Harmonic scalpel, with clips applied to the very prominent appendiceal artery. The base of the appendix was dissected out and divided with a standard load Endo GIA.The appendix was placed in a Endo Catch bag and removed via the Hasson port. The right lower quadrant and pelvis was then irrigated with normal saline which was then aspirated. The right lower quadrant was inspected there was no sign of bleeding or bowel injury therefore pneumoperitoneum was released, all ports were removed.  The umbilical fascia was closed with 0 Vicryl interrupted sutures and the skin incisions were approximated with subcuticular 4-0 Monocryl. Dermabond was applied The patient tolerated the procedure well and there were no immediately apparent complications. The sponge lap and needle count were correct at the end of the procedure.  The patient was taken to the recovery room in stable condition to be admitted for continued care.   Duanne Guess, MD, FACS

## 2020-06-23 NOTE — Anesthesia Preprocedure Evaluation (Addendum)
Anesthesia Evaluation  Patient identified by MRN, date of birth, ID band Patient awake    Reviewed: Allergy & Precautions, NPO status , Patient's Chart, lab work & pertinent test results  History of Anesthesia Complications Negative for: history of anesthetic complications  Airway Mallampati: III  TM Distance: >3 FB Neck ROM: Full    Dental no notable dental hx. (+) Poor Dentition, Chipped, Missing   Pulmonary neg pulmonary ROS, Current Smoker,    Pulmonary exam normal breath sounds clear to auscultation       Cardiovascular Exercise Tolerance: Good METS: 3 - Mets negative cardio ROS Normal cardiovascular exam Rhythm:Regular Rate:Normal     Neuro/Psych negative neurological ROS  negative psych ROS   GI/Hepatic negative GI ROS, Neg liver ROS,   Endo/Other  negative endocrine ROS  Renal/GU negative Renal ROS  negative genitourinary   Musculoskeletal negative musculoskeletal ROS (+)   Abdominal   Peds  Hematology negative hematology ROS (+)   Anesthesia Other Findings   Reproductive/Obstetrics negative OB ROS                            Anesthesia Physical Anesthesia Plan  ASA: 2  Anesthesia Plan: General   Post-op Pain Management:    Induction: Intravenous  PONV Risk Score and Plan: Ondansetron  Airway Management Planned: Oral ETT  Additional Equipment:   Intra-op Plan:   Post-operative Plan: Extubation in OR  Informed Consent: I have reviewed the patients History and Physical, chart, labs and discussed the procedure including the risks, benefits and alternatives for the proposed anesthesia with the patient or authorized representative who has indicated his/her understanding and acceptance.     Dental advisory given  Plan Discussed with: CRNA  Anesthesia Plan Comments: (Benefits and risk discussed with patient to include death, MI and CVA.  Pt wishes to proceed.)        Anesthesia Quick Evaluation

## 2020-06-23 NOTE — Anesthesia Postprocedure Evaluation (Signed)
Anesthesia Post Note  Patient: Dayden Viverette  Procedure(s) Performed: APPENDECTOMY LAPAROSCOPIC  Patient location during evaluation: Endoscopy Anesthesia Type: General Level of consciousness: awake and alert Pain management: pain level controlled Vital Signs Assessment: post-procedure vital signs reviewed and stable Respiratory status: spontaneous breathing, nonlabored ventilation, respiratory function stable and patient connected to nasal cannula oxygen Cardiovascular status: blood pressure returned to baseline and stable Postop Assessment: no apparent nausea or vomiting Anesthetic complications: no   No notable events documented.   Last Vitals:  Vitals:   06/23/20 1630 06/23/20 1645  BP: 130/61 (!) 117/57  Pulse: 92   Resp: 18 16  Temp: (!) 36.3 C (!) 36.4 C  SpO2: 100% 98%    Last Pain:  Vitals:   06/23/20 1700  TempSrc:   PainSc: 0-No pain                 Cleda Mccreedy Emonni Depasquale

## 2020-06-24 ENCOUNTER — Encounter: Payer: Self-pay | Admitting: General Surgery

## 2020-06-24 MED ORDER — IBUPROFEN 800 MG PO TABS: 800.0000 mg | ORAL_TABLET | Freq: Three times a day (TID) | ORAL | 0 refills | Status: DC | PRN

## 2020-06-24 MED ORDER — HYDROCODONE-ACETAMINOPHEN 5-325 MG PO TABS: 1.0000 | ORAL_TABLET | Freq: Four times a day (QID) | ORAL | 0 refills | Status: DC | PRN

## 2020-06-24 NOTE — Progress Notes (Signed)
Mobility Specialist - Progress Note   06/24/20 1200  Mobility  Activity Ambulated in room  Level of Assistance Independent  Assistive Device None  Distance Ambulated (ft) 25 ft  Mobility Ambulated independently in room  Mobility Response Tolerated well  Mobility performed by Mobility specialist  $Mobility charge 1 Mobility    Pt ambulating in room independently. No LOB. No AD in use. Anticipating d/c this date.    Filiberto Pinks Mobility Specialist 06/24/20, 12:44 PM

## 2020-06-24 NOTE — Discharge Summary (Signed)
Physician Discharge Summary  Patient ID: Gary Wilkins MRN: [id number] DOB/AGE: 72-Aug-1952 72 y.o.  Admit date: 06/22/2020 Discharge date: 06/24/2020  Admission Diagnoses: Acute appendicitis.  Discharge Diagnoses:  Active Problems:   Acute appendicitis   Discharged Condition: good  Hospital Course: Underwent surgery, pain controlled, tolerating diet.  Consults: None  Significant Diagnostic Studies: radiology: CT scan: See report.  Treatments: surgery: Laparoscopic appendectomy.  Discharge Exam: Blood pressure 138/73, pulse 79, temperature 98.3 F (36.8 C), temperature source Oral, resp. rate 18, height 5\' 10"  (1.778 m), weight 63.5 kg, SpO2 96 %. GI: soft, non-tender; bowel sounds normal; no masses,  no organomegaly Incision/Wound: Clean, dry and intact.  Steri-Strips intact.  Disposition: Discharge disposition: 01-Home or Self Care      Discharge Instructions     Call MD for:  persistant nausea and vomiting   Complete by: As directed    Call MD for:  redness, tenderness, or signs of infection (pain, swelling, redness, odor or green/yellow discharge around incision site)   Complete by: As directed    Call MD for:  severe uncontrolled pain   Complete by: As directed    Diet - low sodium heart healthy   Complete by: As directed    Discharge wound care:   Complete by: As directed    Your incision was closed with Steri-strips.  It is best to keep it clean and dry, it will tolerate a brief shower, but do not soak it or apply any creams or lotions to the incisions.  The Strips should gradually peel off over time.  Keep it open to air so you can evaluate your incisions.  The strips assist the underlying sutures to keep your incision closed and protected from infection.  Should you develop some drainage from your incision, some drops of drainage would be okay but if it persists continue to put keep a dry dressing over it.   Driving Restrictions   Complete by: As directed    No  driving until cleared after follow-up appointment.  Is not advised to drive while taking narcotic pain medications or in significant pain.   Increase activity slowly   Complete by: As directed    Lifting restrictions   Complete by: As directed    Strongly advised against any form of lifting greater than 15 pounds over the next 4 to 6 weeks.  This involves pushing/pulling movements as well.  After 4 weeks when may gradually engage in more activities remaining aware of any new pain/tenderness elicited, and avoiding those for the full duration of 6 weeks.  Walking is encouraged.  Climbing stairs with caution.      Allergies as of 06/24/2020   No Known Allergies      Medication List     TAKE these medications    HYDROcodone-acetaminophen 5-325 MG tablet Commonly known as: NORCO/VICODIN Take 1 tablet by mouth every 6 (six) hours as needed for moderate pain.   ibuprofen 800 MG tablet Commonly known as: ADVIL Take 1 tablet (800 mg total) by mouth every 8 (eight) hours as needed.               Discharge Care Instructions  (From admission, onward)           Start     Ordered   06/24/20 0000  Discharge wound care:       Comments: Your incision was closed with Steri-strips.  It is best to keep it clean and dry, it will tolerate a brief shower,  but do not soak it or apply any creams or lotions to the incisions.  The Strips should gradually peel off over time.  Keep it open to air so you can evaluate your incisions.  The strips assist the underlying sutures to keep your incision closed and protected from infection.  Should you develop some drainage from your incision, some drops of drainage would be okay but if it persists continue to put keep a dry dressing over it.   06/24/20 1112            Follow-up Information     Donovan Kail, PA-C. Schedule an appointment as soon as possible for a visit in 2 week(s).   Specialty: Physician Assistant Why: s/p laparoscopic  appendectomy Contact information: [address] [place] [address] [phone]                 Signed: Campbell Lerner, M.D., [facility] 06/24/2020, 11:12 AM

## 2020-06-27 LAB — SURGICAL PATHOLOGY

## 2020-07-12 ENCOUNTER — Other Ambulatory Visit: Payer: Self-pay

## 2020-07-12 ENCOUNTER — Ambulatory Visit (INDEPENDENT_AMBULATORY_CARE_PROVIDER_SITE_OTHER): Payer: Medicare Other | Admitting: Physician Assistant

## 2020-07-12 ENCOUNTER — Encounter: Payer: Self-pay | Admitting: Physician Assistant

## 2020-07-12 VITALS — BP 185/78 | HR 79 | Temp 98.6°F | Ht 70.0 in | Wt 133.8 lb

## 2020-07-12 DIAGNOSIS — K353 Acute appendicitis with localized peritonitis, without perforation or gangrene: Secondary | ICD-10-CM

## 2020-07-12 DIAGNOSIS — Z09 Encounter for follow-up examination after completed treatment for conditions other than malignant neoplasm: Secondary | ICD-10-CM

## 2020-07-12 NOTE — Patient Instructions (Addendum)
If you have any concerns or questions, please feel free to call our office.    GENERAL POST-OPERATIVE PATIENT INSTRUCTIONS   WOUND CARE INSTRUCTIONS:  Keep a dry clean dressing on the wound if there is drainage. The initial bandage may be removed after 24 hours.  Once the wound has quit draining you may leave it open to air.  If clothing rubs against the wound or causes irritation and the wound is not draining you may cover it with a dry dressing during the daytime.  Try to keep the wound dry and avoid ointments on the wound unless directed to do so.  If the wound becomes bright red and painful or starts to drain infected material that is not clear, please contact your physician immediately.  If the wound is mildly pink and has a thick firm ridge underneath it, this is normal, and is referred to as a healing ridge.  This will resolve over the next 4-6 weeks.  BATHING: You may shower if you have been informed of this by your surgeon. However, Please do not submerge in a tub, hot tub, or pool until incisions are completely sealed or have been told by your surgeon that you may do so.  DIET:  You may eat any foods that you can tolerate.  It is a good idea to eat a high fiber diet and take in plenty of fluids to prevent constipation.  If you do become constipated you may want to take a mild laxative or take ducolax tablets on a daily basis until your bowel habits are regular.  Constipation can be very uncomfortable, along with straining, after recent surgery.  ACTIVITY:  You are encouraged to cough and deep breath or use your incentive spirometer if you were given one, every 15-30 minutes when awake.  This will help prevent respiratory complications and low grade fevers post-operatively if you had a general anesthetic.  You may want to hug a pillow when coughing and sneezing to add additional support to the surgical area, if you had abdominal or chest surgery, which will decrease pain during these times.   You are encouraged to walk and engage in light activity for the next two weeks.  You should not lift more than 10-15 pounds, until 07/21/2020 as it could put you at increased risk for complications.  Twenty pounds is roughly equivalent to a plastic bag of groceries. At that time- Listen to your body when lifting, if you have pain when lifting, stop and then try again in a few days. Soreness after doing exercises or activities of daily living is normal as you get back in to your normal routine.  MEDICATIONS:  Try to take narcotic medications and anti-inflammatory medications, such as tylenol, ibuprofen, naprosyn, etc., with food.  This will minimize stomach upset from the medication.  Should you develop nausea and vomiting from the pain medication, or develop a rash, please discontinue the medication and contact your physician.  You should not drive, make important decisions, or operate machinery when taking narcotic pain medication.  SUNBLOCK Use sun block to incision area over the next year if this area will be exposed to sun. This helps decrease scarring and will allow you avoid a permanent darkened area over your incision.     Laparoscopic Appendectomy, Adult, Care After This sheet gives you information about how to care for yourself after your procedure. Your doctor may also give you more specific instructions. If youhave problems or questions, contact your doctor. What can  I expect after the procedure? After the procedure, it is common to have: Little energy for normal activities. Mild pain in the area where the cuts from surgery (incisions) were made. Trouble pooping (constipation). This can be caused by: Pain medicine. A lack of activity. Follow these instructions at home: Medicines Take over-the-counter and prescription medicines only as told by your doctor. If you were prescribed an antibiotic medicine, take it as told by your doctor. Do not stop taking it even if you start to feel  better. Do not drive or use heavy machinery while taking prescription pain medicine. Ask your doctor if the medicine you are taking can cause trouble pooping. You may need to take steps to prevent or treat trouble pooping: Drink enough fluid to keep your pee (urine) pale yellow. Take over-the-counter or prescription medicines. Eat foods that are high in fiber. These include beans, whole grains, and fresh fruits and vegetables. Limit foods that are high in fat and sugar. These include fried or sweet foods. Incision care  Follow instructions from your doctor about how to take care of your cuts from surgery. Make sure you: Wash your hands with soap and water before and after you change your bandage (dressing). If you cannot use soap and water, use hand sanitizer. Change your bandage as told by your doctor. Leave stitches (sutures), skin glue, or skin tape (adhesive) strips in place. They may need to stay in place for 2 weeks or longer. If tape strips get loose and curl up, you may trim the loose edges. Do not remove tape strips completely unless your doctor says it is okay. Check your cuts from surgery every day for signs of infection. Check for: Redness, swelling, or pain. Fluid or blood. Warmth. Pus or a bad smell.  Bathing Keep your cuts from surgery clean and dry. Clean them as told by your doctor. To do this: Gently wash the cuts with soap and water. Rinse the cuts with water to remove all soap. Pat the cuts dry with a clean towel. Do not rub the cuts. Do not take baths, swim, or use a hot tub for 2 weeks, or until your doctor says it is okay. You may take showers after 48 hours. Activity  Do not drive for 24 hours if you were given a medicine to help you relax (sedative) during your procedure. Rest after the procedure. Return to your normal activities as told by your doctor. Ask your doctor what activities are safe for you. For 3 weeks, or for as long as told by your doctor: Do not  lift anything that is heavier than 10 lb (4.5 kg), or the limit that you are told. Do not play contact sports.  General instructions If you were sent home with a drain, follow instructions from your doctor on how to care for it. Take deep breaths. This helps to keep your lungs from getting an infection (pneumonia). Keep all follow-up visits as told by your doctor. This is important. Contact a doctor if: You have redness, swelling, or pain around a cut from surgery. You have fluid or blood coming from a cut. Your cut feels warm to the touch. You have pus or a bad smell coming from a cut or a bandage. The edges of a cut break open after the stitches have been taken out. You have pain in your shoulders that gets worse. You feel dizzy or you pass out (faint). You have shortness of breath. You keep feeling sick to your stomach (  nauseous). You keep throwing up (vomiting). You get watery poop (diarrhea) or you cannot control your poop. You lose your appetite. You have swelling or pain in your legs. You get a rash. Get help right away if: You have a fever. You have trouble breathing. You have sharp pains in your chest. Summary After the procedure, it is common to have low energy, mild pain, and trouble pooping. Infection is a common problem after this procedure. Follow your doctor's instructions about caring for yourself after the procedure. Rest after the procedure. Return to your normal activities as told by your doctor. Contact your doctor if you see signs of infection around your cuts from surgery, or you get short of breath. Get help right away if you have a fever, chest pain, or trouble breathing. This information is not intended to replace advice given to you by your health care provider. Make sure you discuss any questions you have with your healthcare provider. Document Revised: 07/03/2017 Document Reviewed: 07/03/2017 Elsevier Patient Education  2022 [employer].

## 2020-07-12 NOTE — Progress Notes (Signed)
[facility] POST-OP OFFICE VISIT  07/12/2020  HPI: Gary Wilkins is a 70 y.o. male 19 days s/p laparoscopic appendectomy for acute appendicitis with Dr Lady Gary   He is doing very well and states "he feels better than even before he had appendicitis." No abdominal pain, fever, chills, nausea, emesis, or bowel changes He initially had decrease in his appetite but this has resolved No issues with incisions; steri-strips have fallen off Ambulating well No other complaints   Vital signs: BP (!) 185/78   Pulse 79   Temp 98.6 F (37 C) (Oral)   Ht 5\' 10"  (1.778 m)   Wt 133 lb 12.8 oz (60.7 kg)   SpO2 97%   BMI 19.20 kg/m    Physical Exam: Constitutional: Well appearing male, NAD Abdomen: Soft, non-tender, non-distended, no rebound.guarding Skin: Laparoscopic incisions are well healed, no erythema or drainage  Assessment/Plan: This is a 70 y.o. male 9 days s/p laparoscopic appendectomy for acute appendicitis   - Pain control prn; OTC medications  - Reviewed wound care  - Reviewed lifting restrictions; 4 weeks total   - Reviewed surgical pathology: appendicitis, negative for malignancy   - He can return to clinic on an as needed basis  -- 60, PA-C [facility] 07/12/2020, 10:40 AM [phone] M-F: 7am - 4pm

## 2020-10-31 ENCOUNTER — Encounter: Payer: Self-pay | Admitting: General Surgery

## 2021-12-23 IMAGING — CT CT ABD-PELV W/O CM
2 of 4 series · 16 of 46 positions shown, 18 images · non-contrast
Comparison: None.

CLINICAL DATA: Acute right lower quadrant abdominal pain.

EXAM:
CT ABDOMEN AND PELVIS WITHOUT CONTRAST
TECHNIQUE: Multidetector CT imaging of the abdomen and pelvis was performed
following the standard protocol without IV contrast.

[Series 2: axial st · axial · 0.78mm/px · z∈[-207,+188]mm · 13 of 87 slices shown, 15 images]
[im 4/87  soft-tissue]
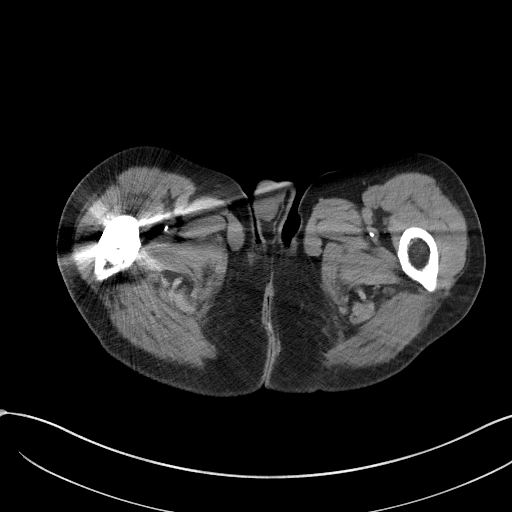
[im 4/87  bone]
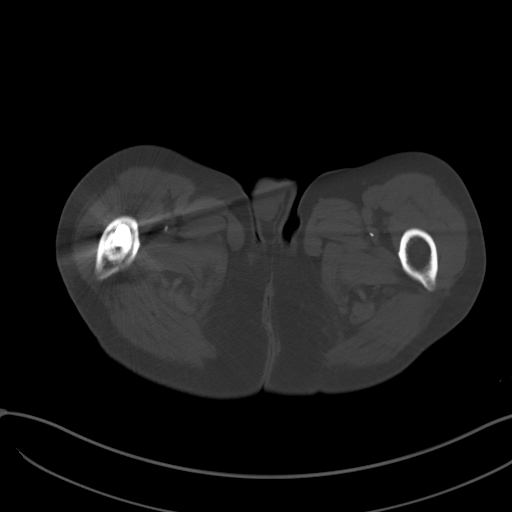
[im 10/87  soft-tissue]
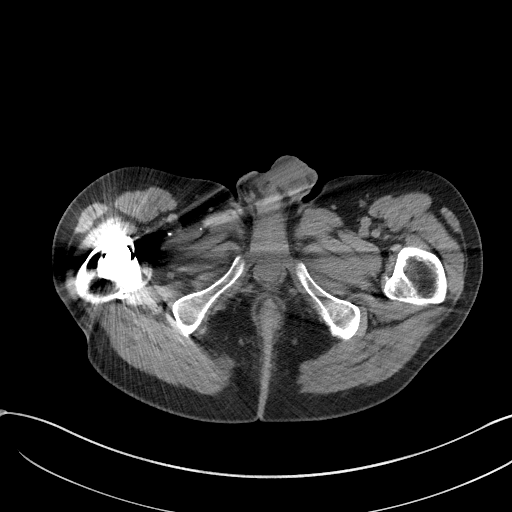
[im 17/87  soft-tissue]
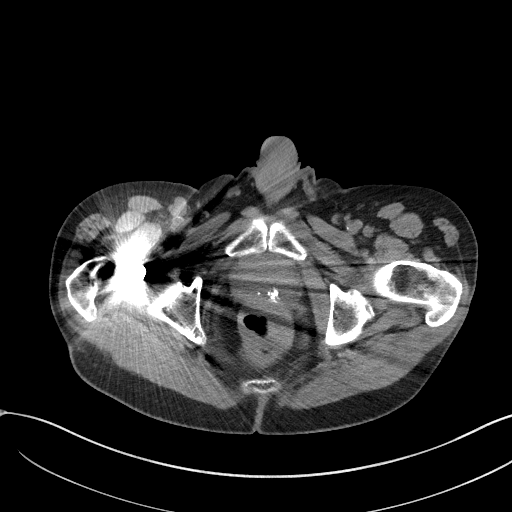
[im 24/87  soft-tissue]
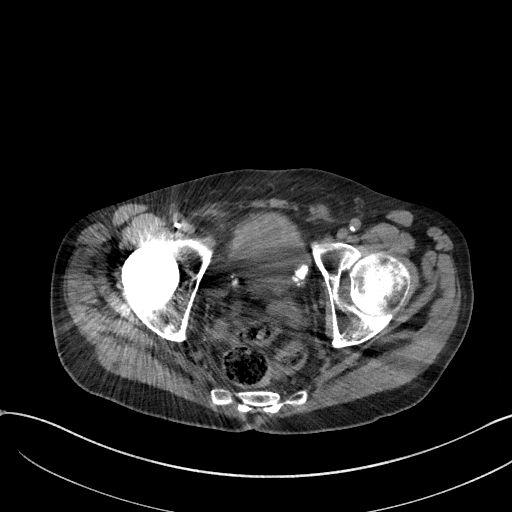
[im 30/87  soft-tissue]
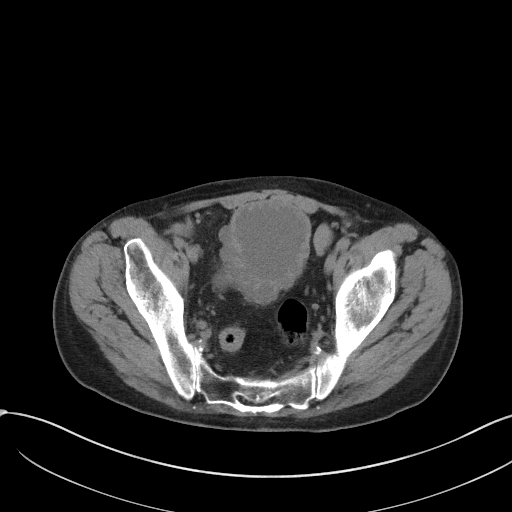
[im 37/87  soft-tissue]
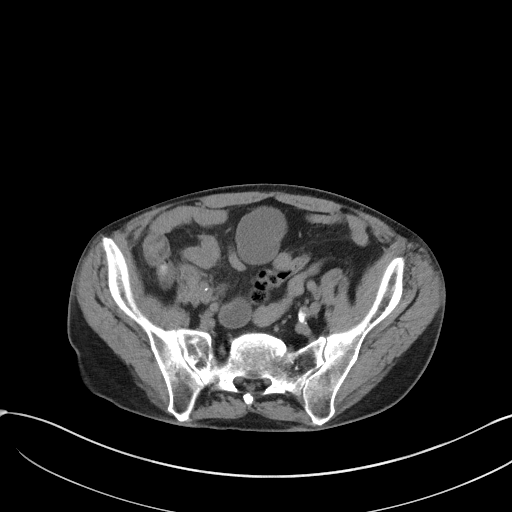
[im 44/87  soft-tissue]
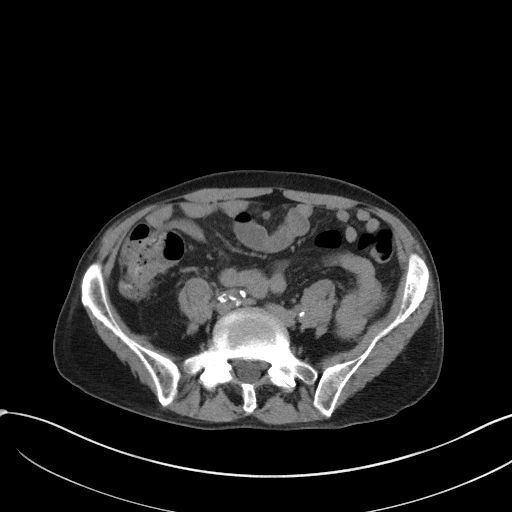
[im 50/87  soft-tissue]
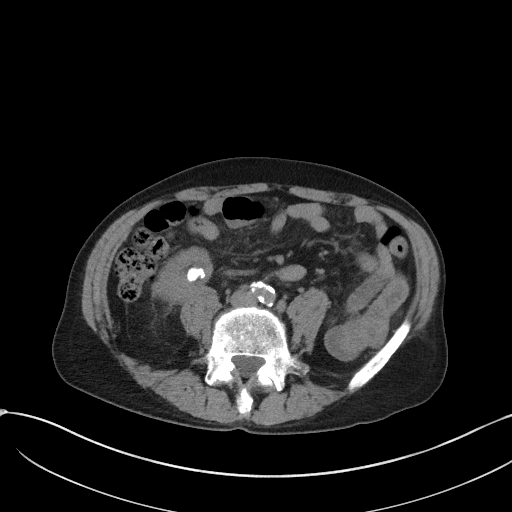
[im 57/87  soft-tissue]
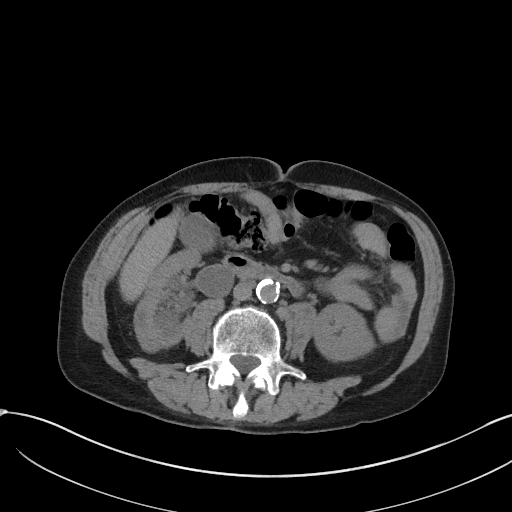
[im 57/87  bone]
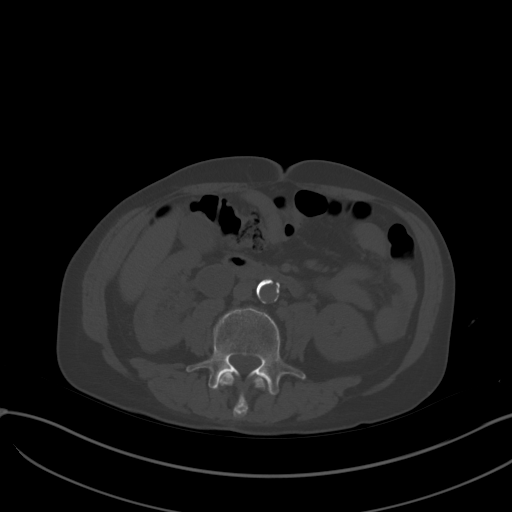
[im 63/87  soft-tissue]
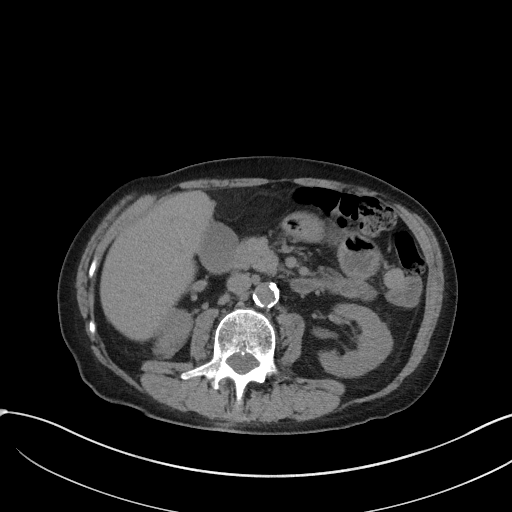
[im 70/87  soft-tissue]
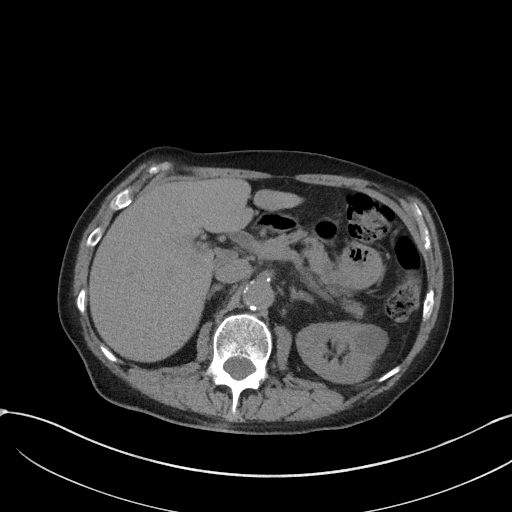
[im 77/87  soft-tissue]
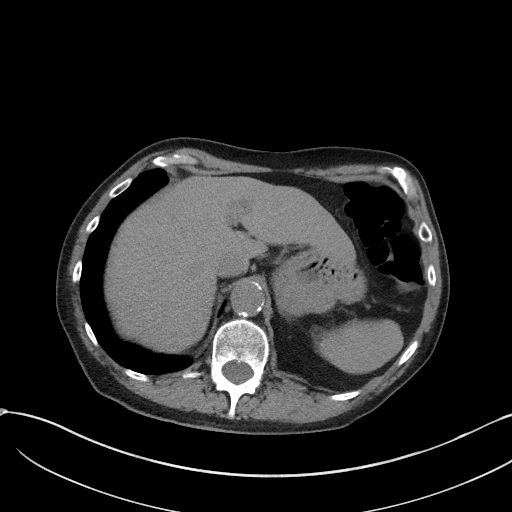
[im 83/87  soft-tissue]
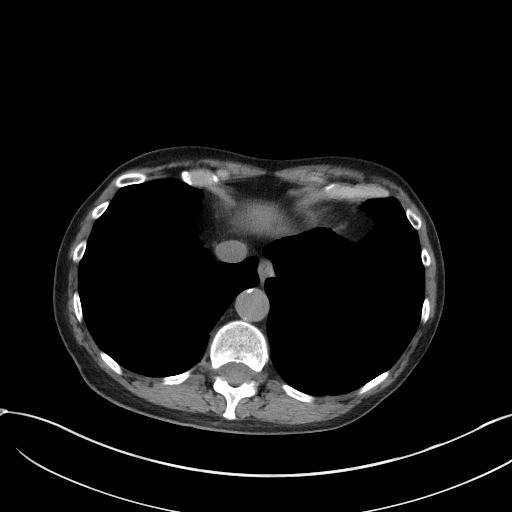

[Series 5: coronal st · coronal · 0.66mm/px · 3 of 73 slices shown]
[im 25/73  soft-tissue]
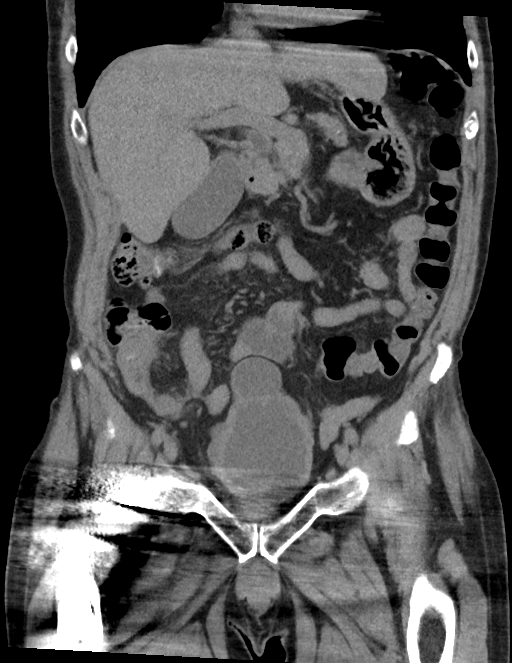
[im 33/73  soft-tissue]
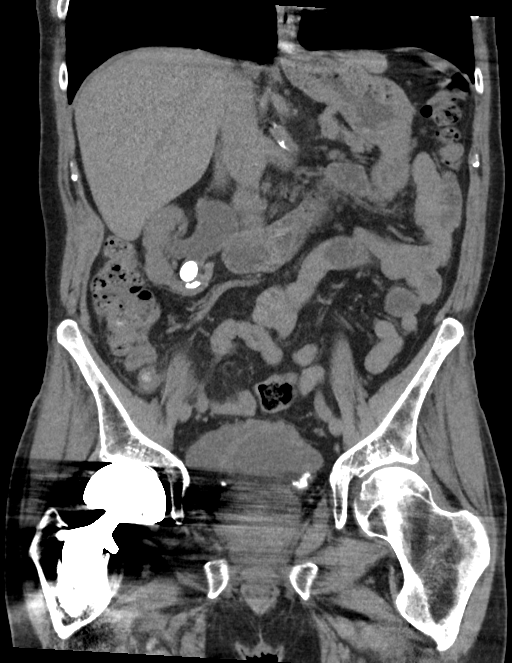
[im 41/73  soft-tissue]
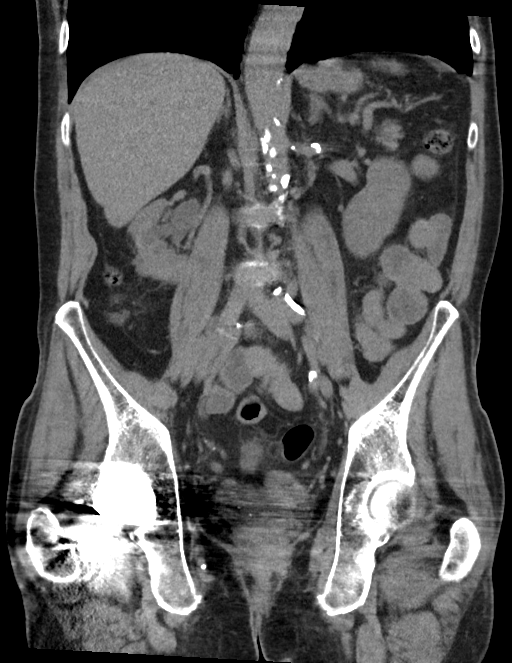

[16 of 46 positions shown; findings below may reference images not displayed]

FINDINGS: Lower chest: No acute abnormality.

Hepatobiliary: No focal liver abnormality is seen. No gallstones,
gallbladder wall thickening, or biliary dilatation.

Pancreas: Unremarkable. No pancreatic ductal dilatation or
surrounding inflammatory changes.

Spleen: Normal in size without focal abnormality.

Adrenals/Urinary Tract: Adrenal glands appear normal. Left renal
cyst is noted. Multiple calculi are noted in the lower pole
collecting system of the right kidney, the largest measuring 16 mm.
Moderate right hydronephrosis is noted without ureteral dilatation
or obstructing calculus, concerning for ureteropelvic junction
stenosis. Multiple bladder diverticula are noted of varying sizes,
some of which contain calcifications.

Stomach/Bowel: The stomach appears normal. There is no evidence of
bowel obstruction. The appendix is enlarged with surrounding
inflammation consistent with appendicitis.

Appendix: Location: Right lower quadrant.

Diameter: 13 mm.

Appendicolith: Yes.

Mucosal hyper-enhancement: Unenhanced exam.

Extraluminal gas: No.

Periappendiceal collection: No.

Vascular/Lymphatic: Aortic atherosclerosis. No enlarged abdominal or
pelvic lymph nodes.

Reproductive: Mild prostatic enlargement is noted.

Other: No abdominal wall hernia or abnormality. No abdominopelvic
ascites.

Musculoskeletal: Status post right total hip arthroplasty. No acute
osseous abnormality is noted.
IMPRESSION: Findings consistent with acute appendicitis. No definite abscess is
noted.

Right nephrolithiasis is noted with moderate right hydronephrosis,
but no ureteral dilatation or obstructing calculus is noted. This is
concerning for ureteropelvic junction stenosis.

Mild prostatic enlargement.

Aortic Atherosclerosis (GIVBE-GII.I).

## 2022-05-04 ENCOUNTER — Ambulatory Visit
Admission: EM | Admit: 2022-05-04 | Discharge: 2022-05-04 | Disposition: A | Discharge status: Home / Self Care | Payer: Medicare Other

## 2022-05-04 DIAGNOSIS — R221 Localized swelling, mass and lump, neck: Secondary | ICD-10-CM

## 2022-05-04 NOTE — ED Notes (Signed)
Patient is being discharged from the Urgent Care and sent to the Emergency Department via private vehicle . Per Cheri Rous, NP, patient is in need of higher level of care due to Neck Mass and needs further imaging. Patient is aware and verbalizes understanding of plan of care.  Vitals:   05/04/22 1115  BP: (!) 155/76  Pulse: 93  Resp: 16  Temp: 98.5 F (36.9 C)  SpO2: 95%

## 2022-05-04 NOTE — ED Provider Notes (Signed)
[facility]    CSN: [id number] Arrival date & time: 05/04/22  1109      History   Chief Complaint Chief Complaint  Patient presents with   Mass    HPI Gary Wilkins is a 72 y.o. male presents for eval ration of a neck mass.  Patient reports 1 week of a nonpainful itchy mass to the right side of his neck that has been increasing in size.  He denies any redness or warmth, fevers or chills.  No injury to the area.  Does have some pain when he turns his head to the left.  He denies any difficulty swallowing or breathing.  States he has a mole to his right shoulder that has been changing recently.  States that is also itchy.  Reports he had similar symptoms in the past on his left side of his neck that resolved without treatment. He has not used any OTC medications for symptoms. No other concerns at this time.   HPI  History reviewed. No pertinent past medical history.  Patient Active Problem List   Diagnosis Date Noted   Acute appendicitis 06/22/2020    Past Surgical History:  Procedure Laterality Date   LAPAROSCOPIC APPENDECTOMY N/A 06/23/2020   Procedure: APPENDECTOMY LAPAROSCOPIC;  Surgeon: Duanne Guess, MD;  Location: [facility] ORS;  Service: General;  Laterality: N/A;   TOTAL HIP ARTHROPLASTY Right        Home Medications    Prior to Admission medications   Not on File    Family History Family History  Problem Relation Age of Onset   Alzheimer's disease Mother    Stroke Father     Social History Social History   Tobacco Use   Smoking status: Every Day    Packs/day: 1    Types: Cigarettes   Smokeless tobacco: Never  Vaping Use   Vaping Use: Never used  Substance Use Topics   Alcohol use: Yes    Alcohol/week: 5.0 standard drinks of alcohol    Types: 5 Shots of liquor per week   Drug use: Not Currently     Allergies   Patient has no known allergies.   Review of Systems Review of Systems  HENT:         Mass to right neck       Physical Exam Triage Vital Signs ED Triage Vitals  Enc Vitals Group     BP 05/04/22 1115 (!) 155/76     Pulse Rate 05/04/22 1115 93     Resp 05/04/22 1115 16     Temp 05/04/22 1115 98.5 F (36.9 C)     Temp Source 05/04/22 1115 Oral     SpO2 05/04/22 1115 95 %     Weight 05/04/22 1115 140 lb (63.5 kg)     Height 05/04/22 1115  (1.753 m)     Head Circumference --      Peak Flow --      Pain Score 05/04/22 1117 0     Pain Loc --      Pain Edu? --      Excl. in GC? --    No data found.  Updated Vital Signs BP (!) 155/76 (BP Location: Left Arm)   Pulse 93   Temp 98.5 F (36.9 C) (Oral)   Resp 16   Ht  (1.753 m)   Wt 140 lb (63.5 kg)   SpO2 95%   BMI 20.67 kg/m   Visual Acuity Right Eye Distance:  Left Eye Distance:   Bilateral Distance:    Right Eye Near:   Left Eye Near:    Bilateral Near:     Physical Exam Vitals and nursing note reviewed.  Constitutional:      Appearance: Normal appearance.  HENT:     Head: Normocephalic and atraumatic.      Mouth/Throat:     Tongue: No lesions. Tongue does not deviate from midline.     Pharynx: Oropharynx is clear. Uvula midline. No uvula swelling.     Comments: Extensive dental caries with missing teeth.  No swelling of the tongue, gums Eyes:     Pupils: Pupils are equal, round, and reactive to light.  Cardiovascular:     Rate and Rhythm: Normal rate.  Pulmonary:     Effort: Pulmonary effort is normal.  Musculoskeletal:     Cervical back: Normal range of motion. No rigidity. Normal range of motion.  Skin:    General: Skin is warm and dry.  Neurological:     General: No focal deficit present.     Mental Status: He is alert and oriented to person, place, and time.  Psychiatric:        Behavior: Behavior normal.      UC Treatments / Results  Labs (all labs ordered are listed, but only abnormal results are displayed) Labs Reviewed - No data to display  EKG   Radiology No results  found.  Procedures Procedures (including critical care time)  Medications Ordered in UC Medications - No data to display  Initial Impression / Assessment and Plan / UC Course  I have reviewed the triage vital signs and the nursing notes.  Pertinent labs & imaging results that were available during my care of the patient were reviewed by me and considered in my medical decision making (see chart for details).     Discussed symptoms and exam with patient.  Discussed limitations and abilities of urgent care.  Advised to go to the emergency room for further evaluation and workup of his neck mass.  He states he is in agreement with plan and will go POV to the emergency room. Final Clinical Impressions(s) / UC Diagnoses   Final diagnoses:  Mass of right side of neck     Discharge Instructions      Please go to the ER for any worsening symptoms    ED Prescriptions   None    PDMP not reviewed this encounter.   Radford Pax, NP 05/04/22 1137

## 2022-05-04 NOTE — Discharge Instructions (Addendum)
Please go to the ER for any worsening symptoms

## 2022-05-04 NOTE — ED Triage Notes (Signed)
Pt c/o mass on R side of neck x1 wk. Hx of mass in neck in the past, denies any pain when swallowing, states area is itchy.

## 2022-05-05 ENCOUNTER — Emergency Department: Payer: Medicare Other

## 2022-05-05 ENCOUNTER — Emergency Department
Admission: EM | Admit: 2022-05-05 | Discharge: 2022-05-05 | Disposition: A | Discharge status: Home / Self Care | Payer: Medicare Other | Attending: Emergency Medicine | Admitting: Emergency Medicine

## 2022-05-05 ENCOUNTER — Other Ambulatory Visit: Payer: Self-pay

## 2022-05-05 DIAGNOSIS — R221 Localized swelling, mass and lump, neck: Secondary | ICD-10-CM | POA: Insufficient documentation

## 2022-05-05 LAB — COMPREHENSIVE METABOLIC PANEL
ALT: 14 U/L (ref 0–44)
AST: 19 U/L (ref 15–41)
Albumin: 3.6 g/dL (ref 3.5–5.0)
Alkaline Phosphatase: 66 U/L (ref 38–126)
Anion gap: 11 (ref 5–15)
BUN: 23 mg/dL (ref 8–23)
CO2: 23 mmol/L (ref 22–32)
Calcium: 9.5 mg/dL (ref 8.9–10.3)
Chloride: 105 mmol/L (ref 98–111)
Creatinine, Ser: 0.84 mg/dL (ref 0.61–1.24)
GFR, Estimated: >60 mL/min (ref >60)
Glucose, Bld: 142 mg/dL — ABNORMAL HIGH (ref 70–99)
Potassium: 4.1 mmol/L (ref 3.5–5.1)
Sodium: 139 mmol/L (ref 135–145)
Total Bilirubin: 0.5 mg/dL (ref 0.3–1.2)
Total Protein: 7.2 g/dL (ref 6.5–8.1)

## 2022-05-05 LAB — CBC WITH DIFFERENTIAL/PLATELET
Abs Immature Granulocytes: 0.09 10*3/uL — ABNORMAL HIGH (ref 0.00–0.07)
Basophils Absolute: 0.1 10*3/uL (ref 0.0–0.1)
Basophils Relative: 1 %
Eosinophils Absolute: 0.1 10*3/uL (ref 0.0–0.5)
Eosinophils Relative: 1 %
HCT: 43.9 % (ref 39.0–52.0)
Hemoglobin: 14.5 g/dL (ref 13.0–17.0)
Immature Granulocytes: 1 %
Lymphocytes Relative: 18 %
Lymphs Abs: 2.1 10*3/uL (ref 0.7–4.0)
MCH: 31.1 pg (ref 26.0–34.0)
MCHC: 33 g/dL (ref 30.0–36.0)
MCV: 94.2 fL (ref 80.0–100.0)
Monocytes Absolute: 1 10*3/uL (ref 0.1–1.0)
Monocytes Relative: 8 %
Neutro Abs: 8.6 10*3/uL — ABNORMAL HIGH (ref 1.7–7.7)
Neutrophils Relative %: 71 %
Platelets: 371 10*3/uL (ref 150–400)
RBC: 4.66 MIL/uL (ref 4.22–5.81)
RDW: 13.4 % (ref 11.5–15.5)
WBC: 12 10*3/uL — ABNORMAL HIGH (ref 4.0–10.5)
nRBC: 0 % (ref 0.0–0.2)

## 2022-05-05 MED ORDER — AMOXICILLIN-POT CLAVULANATE 875-125 MG PO TABS: 1.0000 | ORAL_TABLET | Freq: Two times a day (BID) | ORAL | 0 refills | Status: DC

## 2022-05-05 MED ORDER — IOHEXOL 300 MG/ML  SOLN
75.0000 mL | Freq: Once | INTRAMUSCULAR | Status: AC | PRN
  Administered 2022-05-05: 75 mL via INTRAVENOUS

## 2022-05-05 MED ORDER — PREDNISONE 10 MG (48) PO TBPK: ORAL_TABLET | ORAL | 0 refills | Status: DC

## 2022-05-05 NOTE — ED Triage Notes (Signed)
Pt reports about a week ago he noticed a lump on the right side of his neck. Pt reports the lump has continued to grow in size and itches. Pt reports has hx of eczema so is not sure if the itching is coming from the lump or his eczema. Pt reports hx of lumps on his neck in the past and one was an infected lymph node and one was a clogged salivary gland. Pt denies fever, sob or other sx;s.

## 2022-05-05 NOTE — Discharge Instructions (Signed)
You have a mass on the right side of your neck.  There are concerns that this is a neoplasm which is considered cancerous.  However you will need to be seen by ENT.  Take the antibiotic as prescribed.  Take steroids as prescribed. When you call the ENT office, tell them we spoke with Dr. Jenne Campus today and that he wants you to be seen as soon as possible. Return emergency department if you are worsening Return if you have any headaches or dizziness as this may be pressing on your jugular vein.

## 2022-05-05 NOTE — ED Provider Notes (Signed)
[facility] Provider Note    None    (approximate)   History   Mass   HPI  Gary Wilkins is a 72 y.o. male with history of smoking since he was a kid presents emergency department with a swollen hard area on the right side of his neck.  Patient states he noticed this about 10 days ago.  Similar symptoms about 40 years ago where he had a swollen area on the side of his neck and 1 in the axilla.  Denies any fever or chills.  No chest pain or shortness of breath.  No difficulty swallowing.      Physical Exam   Triage Vital Signs: ED Triage Vitals  Enc Vitals Group     BP 05/05/22 0747 (!) 176/87     Pulse Rate 05/05/22 0747 94     Resp 05/05/22 0747 20     Temp 05/05/22 0747 98.3 F (36.8 C)     Temp Source 05/05/22 0747 Oral     SpO2 05/05/22 0747 99 %     Weight 05/05/22 0745 141 lb 1.5 oz (64 kg)     Height 05/05/22 0745  (1.753 m)     Head Circumference --      Peak Flow --      Pain Score 05/05/22 0745 0     Pain Loc --      Pain Edu? --      Excl. in GC? --     Most recent vital signs: Vitals:   05/05/22 0747  BP: (!) 176/87  Pulse: 94  Resp: 20  Temp: 98.3 F (36.8 C)  SpO2: 99%     General: Awake, no distress.   CV:  Good peripheral perfusion. regular rate and  rhythm Resp:  Normal effort. Lungs cta Abd:  No distention.   Other:  Large hard swollen area was a little larger than a golf ball noted on the right side of the patient's neck, area is hard but not pulsatile.  Is some redness surrounding the area, patient does have poor dentition.  No oral lesions noted   ED Results / Procedures / Treatments   Labs (all labs ordered are listed, but only abnormal results are displayed) Labs Reviewed  COMPREHENSIVE METABOLIC PANEL - Abnormal; Notable for the following components:      Result Value   Glucose, Bld 142 (*)    All other components within normal limits  CBC WITH DIFFERENTIAL/PLATELET - Abnormal; Notable for the  following components:   WBC 12.0 (*)    Neutro Abs 8.6 (*)    Abs Immature Granulocytes 0.09 (*)    All other components within normal limits     EKG     RADIOLOGY CT soft tissue of the neck    PROCEDURES:   Procedures   MEDICATIONS ORDERED IN ED: Medications  iohexol (OMNIPAQUE) 300 MG/ML solution 75 mL (75 mLs Intravenous Contrast Given 05/05/22 0844)     IMPRESSION / MDM / ASSESSMENT AND PLAN / ED COURSE  I reviewed the triage vital signs and the nursing notes.                              Differential diagnosis includes, but is not limited to, neck mass, abscess, infected lymph node  Patient's presentation is most consistent with acute presentation with potential threat to life or bodily function.   Due to the patient's history  along with the size of the mass will do CT soft tissue of the neck, labs will also be ordered   Labs are reassuring  CT soft tissue of the neck, I did review the radiologist report, and interpret this as being concerning for neoplasm/malignancy.  Radiologist is concerned it may be pressing on the jugular vein but the patient does not have any dizziness or headache at this time.  Discussed with Dr. Scotty Court.  Would like for me to call ENT  Spoke with Dr. Jenne Campus from ENT, he would like for me to place the patient on Augmentin and a 12-day Sterapred pack.  Have him call his office in the morning for an appointment.  States one of them will see him as soon as possible.  I did explain everything to the patient.  He is in agreement treatment plan at this time.  He was given strict instructions to return if he has dizziness or headache secondary to the mass possibly pressing on the jugular vein.  Patient is in agreement with treatment plan.  Discharged in stable condition.   FINAL CLINICAL IMPRESSION(S) / ED DIAGNOSES   Final diagnoses:  Neck mass     Rx / DC Orders   ED Discharge Orders          Ordered    amoxicillin-clavulanate  (AUGMENTIN) 875-125 MG tablet  2 times daily        05/05/22 1002    predniSONE (STERAPRED UNI-PAK 48 TAB) 10 MG (48) TBPK tablet        05/05/22 1002             Note:  This document was prepared using Dragon voice recognition software and may include unintentional dictation errors.    Faythe Ghee, PA-C 05/05/22 1008    Sharman Cheek, MD 05/06/22 2231

## 2022-05-09 ENCOUNTER — Other Ambulatory Visit: Payer: Self-pay | Admitting: Unknown Physician Specialty

## 2022-05-09 DIAGNOSIS — R221 Localized swelling, mass and lump, neck: Secondary | ICD-10-CM

## 2022-05-09 NOTE — Progress Notes (Signed)
Richarda Overlie, MD sent to Gary Wilkins S PROCEDURE / BIOPSY REVIEW Date: 05/08/22  Requested Biopsy site: Right neck mass Reason for request: tissue diagnosis Imaging review: Best seen on CT neck  Decision: Approved Imaging modality to perform: Ultrasound Schedule with: Patient preference (Local vs Mod Sed) Schedule for: Any VIR  Additional comments:   Please contact me with questions, concerns, or if issue pertaining to this request arise.  Arn Medal, MD [facility]       Previous Messages    ----- Message ----- From: [name] Sent: 05/08/2022   2:28 PM EDT To: Ir Procedure Requests Subject: US Biopsy                                      IR Approval Request:   Procedure:  US guided Core RT Neck Mass Biopsy  Reason:      RT Neck Mass  History:       CT soft tissue neck w/ contrast     05/05/2022  Provider:     Dr Linus Salmons, MD    Provider Contact #      [facility]   [phone]

## 2022-05-10 ENCOUNTER — Telehealth: Payer: Self-pay

## 2022-05-10 NOTE — Telephone Encounter (Signed)
        Patient  visited [place] on 4/21    Telephone encounter attempt :  2nd  A HIPAA compliant voice message was left requesting a return call.  Instructed patient to call back.    Lenard Forth [facility] Guide, [facility] [phone] [address] [place], [place], [place] [address] Phone: [phone] Email: [email or web address]

## 2022-05-10 NOTE — Telephone Encounter (Signed)
        Patient  visited [place] on 4/21     Telephone encounter attempt :  1st  A HIPAA compliant voice message was left requesting a return call.  Instructed patient to call back.    Gary Wilkins [facility] Guide, [place] [phone] [address], [place], [place] [address] Phone: [phone] Email: [email or web address]       

## 2022-05-15 ENCOUNTER — Other Ambulatory Visit: Payer: Self-pay | Admitting: Student

## 2022-05-15 NOTE — Progress Notes (Signed)
Patient for US guided RT Neck Mass Biopsy on Thurs 05/16/2022, I called and spoke with the patient on the phone and gave pre-procedure instructions. Pt was made aware to be here at 2:30p and check in at the [facility] registration. Pt stated understanding.  Called 05/15/2022

## 2022-05-16 ENCOUNTER — Ambulatory Visit
Admission: RE | Admit: 2022-05-16 | Discharge: 2022-05-16 | Disposition: A | Discharge status: Home / Self Care | Payer: Medicare Other | Source: Ambulatory Visit | Attending: Unknown Physician Specialty | Admitting: Unknown Physician Specialty

## 2022-05-16 DIAGNOSIS — C760 Malignant neoplasm of head, face and neck: Secondary | ICD-10-CM | POA: Insufficient documentation

## 2022-05-16 DIAGNOSIS — R221 Localized swelling, mass and lump, neck: Secondary | ICD-10-CM

## 2022-05-16 MED ORDER — LIDOCAINE HCL (PF) 1 % IJ SOLN
4.0000 mL | Freq: Once | INTRAMUSCULAR | Status: AC
  Administered 2022-05-16: 4 mL via INTRADERMAL

## 2022-05-16 NOTE — Procedures (Signed)
Vascular and Interventional Radiology Procedure Note  Patient: Ethyn Schetter DOB: [birth year] Medical Record Number: [id number] Note Date/Time: 05/16/22 2:40 PM   Performing Physician: Roanna Banning, MD Assistant(s): None  Diagnosis: Enlarging R neck mass   Procedure: RIGHT CERVICAL MASS BIOPSY  Anesthesia: Local Anesthetic Complications: None Estimated Blood Loss: Minimal Specimens: Sent for Pathology  Findings:  Successful Ultrasound-guided biopsy of R neck mass . A total of 4 samples were obtained. Hemostasis of the tract was achieved using Manual Pressure.  Plan: Bed rest for 0 hours.  See detailed procedure note with images in PACS. The patient tolerated the procedure well without incident or complication and was returned to Recovery in stable condition.    Roanna Banning, MD [facility] Radiology   Pager. [phone] Clinic. [phone]

## 2022-05-17 ENCOUNTER — Other Ambulatory Visit: Payer: Self-pay | Admitting: Pathology

## 2022-05-17 LAB — SURGICAL PATHOLOGY

## 2022-05-20 ENCOUNTER — Other Ambulatory Visit: Payer: Self-pay | Admitting: Unknown Physician Specialty

## 2022-05-20 DIAGNOSIS — C760 Malignant neoplasm of head, face and neck: Secondary | ICD-10-CM

## 2022-05-27 ENCOUNTER — Ambulatory Visit
Admission: RE | Admit: 2022-05-27 | Discharge: 2022-05-27 | Disposition: A | Discharge status: Home / Self Care | Payer: Medicare Other | Source: Ambulatory Visit | Attending: Unknown Physician Specialty | Admitting: Unknown Physician Specialty

## 2022-05-27 DIAGNOSIS — R918 Other nonspecific abnormal finding of lung field: Secondary | ICD-10-CM | POA: Insufficient documentation

## 2022-05-27 DIAGNOSIS — R590 Localized enlarged lymph nodes: Secondary | ICD-10-CM | POA: Insufficient documentation

## 2022-05-27 DIAGNOSIS — C760 Malignant neoplasm of head, face and neck: Secondary | ICD-10-CM | POA: Diagnosis present

## 2022-05-27 DIAGNOSIS — N323 Diverticulum of bladder: Secondary | ICD-10-CM | POA: Diagnosis not present

## 2022-05-27 LAB — GLUCOSE, CAPILLARY: Glucose-Capillary: 111 mg/dL — ABNORMAL HIGH (ref 70–99)

## 2022-05-27 MED ORDER — FLUDEOXYGLUCOSE F - 18 (FDG) INJECTION
7.7600 | Freq: Once | INTRAVENOUS | Status: AC | PRN
  Administered 2022-05-27: 7.76 via INTRAVENOUS

## 2022-05-28 ENCOUNTER — Inpatient Hospital Stay: Payer: Medicare Other | Attending: Oncology

## 2022-05-28 ENCOUNTER — Encounter: Payer: Self-pay | Admitting: Oncology

## 2022-05-28 ENCOUNTER — Inpatient Hospital Stay: Payer: Medicare Other | Admitting: Oncology

## 2022-05-28 VITALS — BP 126/76 | HR 88 | Temp 97.1°F | Resp 16 | Ht 69.0 in | Wt 132.0 lb

## 2022-05-28 DIAGNOSIS — R634 Abnormal weight loss: Secondary | ICD-10-CM | POA: Insufficient documentation

## 2022-05-28 DIAGNOSIS — C760 Malignant neoplasm of head, face and neck: Secondary | ICD-10-CM

## 2022-05-28 DIAGNOSIS — F17210 Nicotine dependence, cigarettes, uncomplicated: Secondary | ICD-10-CM | POA: Diagnosis not present

## 2022-05-28 LAB — CMP (CANCER CENTER ONLY)
ALT: 15 U/L (ref 0–44)
AST: 18 U/L (ref 15–41)
Albumin: 4.2 g/dL (ref 3.5–5.0)
Alkaline Phosphatase: 69 U/L (ref 38–126)
Anion gap: 9 (ref 5–15)
BUN: 23 mg/dL (ref 8–23)
CO2: 27 mmol/L (ref 22–32)
Calcium: 9.9 mg/dL (ref 8.9–10.3)
Chloride: 103 mmol/L (ref 98–111)
Creatinine: 0.86 mg/dL (ref 0.61–1.24)
GFR, Estimated: >60 mL/min (ref >60)
Glucose, Bld: 132 mg/dL — ABNORMAL HIGH (ref 70–99)
Potassium: 4.8 mmol/L (ref 3.5–5.1)
Sodium: 139 mmol/L (ref 135–145)
Total Bilirubin: 0.6 mg/dL (ref 0.3–1.2)
Total Protein: 7.7 g/dL (ref 6.5–8.1)

## 2022-05-28 LAB — CBC WITH DIFFERENTIAL/PLATELET
Abs Immature Granulocytes: 0.15 10*3/uL — ABNORMAL HIGH (ref 0.00–0.07)
Basophils Absolute: 0.1 10*3/uL (ref 0.0–0.1)
Basophils Relative: 0 %
Eosinophils Absolute: 0.3 10*3/uL (ref 0.0–0.5)
Eosinophils Relative: 2 %
HCT: 45.1 % (ref 39.0–52.0)
Hemoglobin: 14.8 g/dL (ref 13.0–17.0)
Immature Granulocytes: 1 %
Lymphocytes Relative: 17 %
Lymphs Abs: 2.3 10*3/uL (ref 0.7–4.0)
MCH: 31.4 pg (ref 26.0–34.0)
MCHC: 32.8 g/dL (ref 30.0–36.0)
MCV: 95.8 fL (ref 80.0–100.0)
Monocytes Absolute: 1 10*3/uL (ref 0.1–1.0)
Monocytes Relative: 7 %
Neutro Abs: 10.2 10*3/uL — ABNORMAL HIGH (ref 1.7–7.7)
Neutrophils Relative %: 73 %
Platelets: 354 10*3/uL (ref 150–400)
RBC: 4.71 MIL/uL (ref 4.22–5.81)
RDW: 13.8 % (ref 11.5–15.5)
WBC: 14 10*3/uL — ABNORMAL HIGH (ref 4.0–10.5)
nRBC: 0 % (ref 0.0–0.2)

## 2022-05-28 NOTE — Progress Notes (Signed)
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  INTERVAL HISTORY: Patient is a 72 year old male who noticed a small bump on his neck 3 to 4 weeks ago that has rapidly grown over that same timeframe.  Subsequent CT scan and biopsy confirmed diagnosis.  He otherwise feels well and is asymptomatic.  He does not complain of any pain or dysphagia.  He has a good appetite but has approximately 8 pound weight loss.  He has no neurologic complaints.  He denies any recent fevers or illnesses.  He has no chest pain, shortness of breath, cough, or hemoptysis.  He denies any nausea, vomiting, constipation, or diarrhea.  He has no urinary complaints.  Patient offers no further specific complaints today.  REVIEW OF SYSTEMS:   Review of Systems  Constitutional:  Positive for weight loss. Negative for fever and malaise/fatigue.  HENT:  Negative for sore throat.   Respiratory: Negative.  Negative for cough, hemoptysis and shortness of breath.   Cardiovascular: Negative.  Negative for chest pain and leg swelling.  Gastrointestinal: Negative.  Negative for abdominal pain.  Genitourinary: Negative.  Negative for dysuria.  Musculoskeletal: Negative.  Negative for back pain.  Skin: Negative.  Negative for rash.  Neurological: Negative.  Negative for dizziness, focal weakness, weakness and headaches.  Psychiatric/Behavioral: Negative.  The patient is not nervous/anxious.     As per HPI. Otherwise, a complete review of systems is negative.  PAST MEDICAL HISTORY: History reviewed. No pertinent past medical history.  PAST SURGICAL HISTORY: Past Surgical History:  Procedure Laterality Date   APPENDECTOMY  06/22/20   JOINT REPLACEMENT  02/26/2003   hip   LAPAROSCOPIC APPENDECTOMY N/A 06/23/2020    Procedure: APPENDECTOMY LAPAROSCOPIC;  Surgeon: Duanne Guess, MD;  Location: [facility] ORS;  Service: General;  Laterality: N/A;   TOTAL HIP ARTHROPLASTY Right     FAMILY HISTORY: Family History  Problem Relation Age of Onset   Alzheimer's disease Mother    Stroke Father     ADVANCED DIRECTIVES (Y/N):  N  HEALTH MAINTENANCE: Social History   Tobacco Use   Smoking status: Every Day    Packs/day: .5    Types: Cigarettes   Smokeless tobacco: Never  Vaping Use   Vaping Use: Never used  Substance Use Topics   Alcohol use: Yes    Alcohol/week: 5.0 standard drinks of alcohol    Types: 5 Shots of liquor per week   Drug use: Not Currently     Colonoscopy:  PAP:  Bone density:  Lipid panel:  No Known Allergies  Current Outpatient Medications  Medication Sig Dispense Refill   amoxicillin-clavulanate (AUGMENTIN) 875-125 MG tablet Take 1 tablet by mouth 2 (two) times daily. 20 tablet 0   No current facility-administered medications for this visit.    OBJECTIVE: Vitals:   05/28/22 0956  BP: 126/76  Pulse: 88  Resp: 16  Temp: (!) 97.1 F (36.2 C)  SpO2: 100%     Body mass index is 19.49 kg/m.    ECOG FS:0 - Asymptomatic  General: Well-developed, well-nourished, no acute distress. Eyes: Pink conjunctiva, anicteric sclera. HEENT: Normocephalic, moist mucous membranes.  Easily palpable 5 cm mass on right neck. Lungs: No audible wheezing or coughing. Heart: Regular rate and rhythm. Abdomen: Soft, nontender, no obvious distention. Musculoskeletal: No edema, cyanosis, or  clubbing. Neuro: Alert, answering all questions appropriately. Cranial nerves grossly intact. Skin: No rashes or petechiae noted. Psych: Normal affect.  LAB RESULTS:  Lab Results  Component Value Date   NA 139 05/28/2022   K 4.8 05/28/2022   CL 103 05/28/2022   CO2 27 05/28/2022   GLUCOSE 132 (H) 05/28/2022   BUN 23 05/28/2022   CREATININE 0.86 05/28/2022   CALCIUM 9.9 05/28/2022   PROT 7.7  05/28/2022   ALBUMIN 4.2 05/28/2022   AST 18 05/28/2022   ALT 15 05/28/2022   ALKPHOS 69 05/28/2022   BILITOT 0.6 05/28/2022   GFRNONAA >60 05/28/2022    Lab Results  Component Value Date   WBC 14.0 (H) 05/28/2022   NEUTROABS 10.2 (H) 05/28/2022   HGB 14.8 05/28/2022   HCT 45.1 05/28/2022   MCV 95.8 05/28/2022   PLT 354 05/28/2022     STUDIES: [place] CORE BIOPSY (SOFT TISSUE)  Result Date: 05/16/2022 INDICATION: RT Neck Mass EXAM: ULTRASOUND-GUIDED RIGHT CERVICAL NODAL MASS BIOPSY COMPARISON:  CT neck, 05/05/2022 MEDICATIONS: None ANESTHESIA/SEDATION: Local anesthetic was administered. COMPLICATIONS: None immediate. TECHNIQUE: Informed written consent was obtained from the patient and/or patient's representative after a discussion of the risks, benefits and alternatives to treatment. Questions regarding the procedure were encouraged and answered. Initial ultrasound scanning demonstrated large RIGHT cervical nodal mass. An ultrasound image was saved for documentation purposes. The procedure was planned. A timeout was performed prior to the initiation of the procedure. The operative was prepped and draped in the usual sterile fashion, and a sterile drape was applied covering the operative field. A timeout was performed prior to the initiation of the procedure. Local anesthesia was provided with 1% lidocaine with epinephrine. Under direct ultrasound guidance, an 18 gauge core needle device was utilized to obtain to obtain 4 core needle biopsies of the RIGHT cervical nodal mass. The samples were placed in saline and submitted to pathology. The needle was removed and superficial hemostasis was achieved with manual compression. Post procedure scan was negative for significant hematoma. A dressing was applied. The patient tolerated the procedure well without immediate postprocedural complication. IMPRESSION: Successful core biopsy of RIGHT cervical nodal mass. Roanna Banning, MD [facility] Electronically Signed   By: Roanna Banning M.D.   On: 05/16/2022 15:49   CT Soft Tissue Neck W Contrast  Result Date: 05/05/2022 CLINICAL DATA:  72 year old male discovered right side neck mass 1 week ago, seemingly enlarging. EXAM: CT NECK WITH CONTRAST TECHNIQUE: Multidetector CT imaging of the neck was performed using the standard protocol following the bolus administration of intravenous contrast. RADIATION DOSE REDUCTION: This exam was performed according to the departmental dose-optimization program which includes automated exposure control, adjustment of the mA and/or kV according to patient size and/or use of iterative reconstruction technique. CONTRAST:  75mL OMNIPAQUE IOHEXOL 300 MG/ML  SOLN COMPARISON:  CT Abdomen and Pelvis 06/22/2020. FINDINGS: Pharynx and larynx: The glottis is closed. Small focus of asymmetric mucosal enhancement in the Right Piriform sinus (series 2, image 65). Other laryngeal soft tissue contours and enhancement appear symmetric. No discrete pharyngeal mass. Superior parapharyngeal spaces, retropharyngeal space are negative. Salivary glands: Negative sublingual space. Left submandibular glands and parotid glands are normal. Relatively large, heterogeneous right neck mass with indistinct margins, heterogeneous enhancement measures up to 5 cm long axis (38 x 45 x 41 mm - AP by transverse by CC) And is inseparable from both the inferior right parotid gland (sagittal image 10) and the posterior  right submandibular gland (series 2, image 53) with mass effect on both. The lesion is also broadly inseparable from the right sternocleidomastoid muscle which is located along its posterior margin. The superficial aspect of the mass closely approximates the dermis on coronal image 43. and there is some regional inflammation and moderate thickening of the adjacent platysma. Above the mass the right parotid parenchyma seems to remain normal. Right  stylomastoid foramen is normal. Thyroid: Negative. Lymph nodes: Aside from the heterogeneous right neck mass, there is an abnormally enlarged 15 mm right level 2A lymph node inseparable from the superior medial margin of the mass (series 2, image 45). And other small but asymmetrically enhancing right level 3 lymph nodes (series 2, image 76). Left level 1B lymph nodes are also at the upper limits of normal, but no suspicious left cervical lymphadenopathy is identified. Vascular: The large right neck mass is inseparable from lateral right carotid space, effaces and possibly occludes the right internal jugular vein (not visible above series 2, image 61. The right carotid remains patent with bulky superimposed right ICA origin and bulb calcified plaque which appears to be hemodynamically significant on series 2, image 55. Other major vascular structures in the neck and at the skull base are patent. There is bulky left cervical vertebral artery calcified plaque. Limited intracranial: Negative. Visualized orbits: Minimally included. Mastoids and visualized paranasal sinuses: Clear. Skeleton: Poor dentition throughout. The right angle of the mandible is abutted by the right neck mass but remains intact. Cervical spine degeneration. No acute or suspicious osseous lesion. Upper chest: Large lung volumes. Centrilobular emphysema. Sub solid right upper lobe, ground-glass 8 mm nodule series 4, image 101. Small 3 mm solid left upper lobe nodule series 4, image 110. Calcified aortic atherosclerosis. No superior mediastinal lymphadenopathy. Other findings: There is a small and fairly circumscribed 8-9 mm area of abnormal skin thickening of the right face at the level of the anterior parotid space above the mass series 2, image 28. IMPRESSION: 1. Large, heterogeneous Right Neck Mass (nearly 5 cm long axis). This has a Malignant appearance, although origin is indeterminate (inseparable from all of: Right parotid, right  submandibular, right sternocleidomastoid, and right carotid spaces). The mass effaces and might Occlude the right internal jugular vein. 2. Large malignant lymph node is a top consideration. No obvious primary pharyngeal or laryngeal tumor is identified - although small focus of asymmetric mucosal enhancement in the Right Piriform sinus (series 2, image 65) - recommend direct inspection. Metastatic skin cancer is not excluded (see series 2, image 28). Malignant salivary gland tumor or Sarcoma are also possibilities. 3. Abnormal Right level 2A lymph node directly above the dominant mass on series 2, image 45. Other small but enhancing and suspicious right level 3 lymph nodes. 4. Emphysema (ICD10-J43.9). Small pulmonary nodules. Most significant: Right part-solid pulmonary nodule within the upper lobe measuring 8 mm. Per Fleischner Society Guidelines, recommend a non-contrast Chest CT at 3-6 months to confirm persistence. If unchanged and the solid component remains < 6 mm, an annual non-contrast Chest CT should be performed for 5 years. If the solid component is 6-8 mm on follow-up, recommend biopsy or resection. If the solid component is > 8 mm on follow-up, recommend PET/CT, biopsy or resection.For lung cancer screening, adhere to Lung-RADS guidelines. Reference: Radiology. 2017; 284(1):228-43. 5. Aortic Atherosclerosis (ICD10-I70.0). Hemodynamically significant proximal right ICA and cervical left vertebral artery atherosclerotic stenosis. 6. Poor dentition. Electronically Signed   By: Odessa Fleming M.D.   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  CT does not distinctly indicate a primary lesion.  PET scan results from yesterday are pending at time of dictation.  Biopsy confirms diagnosis.  Barring  any metastatic disease on PET scan, patient will benefit from concurrent chemotherapy with weekly cisplatin along with daily XRT.  A referral was sent to radiation oncology today.  Patient does not require port placement, but would consider one in the future if IV access became an issue.  Patient will return to clinic in approximately 2 weeks to initiate cycle 1 of concurrent cisplatin and XRT.  I spent a total of 60 minutes reviewing chart data, face-to-face evaluation with the patient, counseling and coordination of care as detailed above.  Patient expressed understanding and was in agreement with this plan. He also understands that He can call clinic at any time with any questions, concerns, or complaints.    Cancer Staging  No matching staging information was found for the patient.  Jeralyn Ruths, MD   05/28/2022 11:10 AM

## 2022-05-30 ENCOUNTER — Other Ambulatory Visit: Payer: Commercial Managed Care - PPO

## 2022-05-31 ENCOUNTER — Inpatient Hospital Stay: Payer: Medicare Other

## 2022-06-03 ENCOUNTER — Encounter: Payer: Self-pay | Admitting: Radiation Oncology

## 2022-06-03 ENCOUNTER — Ambulatory Visit
Admission: RE | Admit: 2022-06-03 | Discharge: 2022-06-03 | Disposition: A | Discharge status: Home / Self Care | Payer: Medicare Other | Source: Ambulatory Visit | Attending: Radiation Oncology | Admitting: Radiation Oncology

## 2022-06-03 VITALS — BP 127/64 | HR 92 | Temp 97.6°F | Resp 20 | Wt 131.5 lb

## 2022-06-03 DIAGNOSIS — C760 Malignant neoplasm of head, face and neck: Secondary | ICD-10-CM

## 2022-06-04 NOTE — Progress Notes (Signed)
NEW PATIENT EVALUATION  Name: Gary Wilkins  MRN: [id number]  Date:   06/03/2022     DOB: 1950/04/05   This 72 y.o. male patient presents to the clinic for initial evaluation of stage IVa squamous cell carcinoma of the head and neck.  REFERRING PHYSICIAN: Jeralyn Ruths, MD  CHIEF COMPLAINT:  Chief Complaint  Patient presents with   Consult    DIAGNOSIS: The encounter diagnosis was Malignant neoplasm of head, face, and neck (HCC).   PREVIOUS INVESTIGATIONS:  CT scan and PET CT scan reviewed Clinical notes reviewed Pathology report reviewed Case presented at weekly tumor conference  HPI: Patient is a 72 year old male who noticed a progressive mass in his right neck which rapidly increased in size over the past 3 to 4 weeks.  Is beginning to ooze some necrotic debris.  Initial CT scan showed a large heterogeneous right neck mass nearly 5 cm with malignant appearance..  The mass effaces and might occlude the right internal jugular vein he also has abnormal right level 2A lymph nodes directly above the dominant mass.  Core biopsy was performed showing moderately differentiated squamous cell carcinoma.  PET CT scan showed large hypermetabolic superficial right neck mass consistent with primary head and neck cancer.  There were smaller hypermetabolic level 2 lymph nodes in the right neck.  There is also small hypermetabolic focus in the right hypopharynx piriform sinus potentially the primary lesion.  Patient's teeth are in poor state of repair.  He has been seen by medical medical oncology with recommendation for concurrent chemoradiation.  He is seen today for evaluation by radiation oncology.  He specifically denies any dysphagia or head and neck pain.  PLANNED TREATMENT REGIMEN: Concurrent chemoradiation  PAST MEDICAL HISTORY:  has no past medical history on file.    PAST SURGICAL HISTORY:  Past Surgical History:  Procedure Laterality Date   APPENDECTOMY  06/22/20   JOINT  REPLACEMENT  02/26/2003   hip   LAPAROSCOPIC APPENDECTOMY N/A 06/23/2020   Procedure: APPENDECTOMY LAPAROSCOPIC;  Surgeon: Duanne Guess, MD;  Location: [facility] ORS;  Service: General;  Laterality: N/A;   TOTAL HIP ARTHROPLASTY Right     FAMILY HISTORY: family history includes Alzheimer's disease in his mother; Stroke in his father.  SOCIAL HISTORY:  reports that he has been smoking cigarettes. He has been smoking an average of .5 packs per day. He has never used smokeless tobacco. He reports current alcohol use of about 5.0 standard drinks of alcohol per week. He reports that he does not currently use drugs.  ALLERGIES: Patient has no known allergies.  MEDICATIONS:  Current Outpatient Medications  Medication Sig Dispense Refill   amoxicillin-clavulanate (AUGMENTIN) 875-125 MG tablet Take 1 tablet by mouth 2 (two) times daily. 20 tablet 0   No current facility-administered medications for this encounter.    ECOG PERFORMANCE STATUS:  0 - Asymptomatic  REVIEW OF SYSTEMS: Patient denies any weight loss, fatigue, weakness, fever, chills or night sweats. Patient denies any loss of vision, blurred vision. Patient denies any ringing  of the ears or hearing loss. No irregular heartbeat. Patient denies heart murmur or history of fainting. Patient denies any chest pain or pain radiating to her upper extremities. Patient denies any shortness of breath, difficulty breathing at night, cough or hemoptysis. Patient denies any swelling in the lower legs. Patient denies any nausea vomiting, vomiting of blood, or coffee ground material in the vomitus. Patient denies any stomach pain. Patient states has had normal bowel movements no significant  constipation or diarrhea. Patient denies any dysuria, hematuria or significant nocturia. Patient denies any problems walking, swelling in the joints or loss of balance. Patient denies any skin changes, loss of hair or loss of weight. Patient denies any excessive worrying  or anxiety or significant depression. Patient denies any problems with insomnia. Patient denies excessive thirst, polyuria, polydipsia. Patient denies any swollen glands, patient denies easy bruising or easy bleeding. Patient denies any recent infections, allergies or URI. Patient "s visual fields have not changed significantly in recent time.   PHYSICAL EXAM: BP 127/64   Pulse 92   Temp 97.6 F (36.4 C)   Resp 20   Wt 131 lb 8 oz (59.6 kg)   SpO2 100%   BMI 19.42 kg/m  Patient is a fixed large egg shaped mass in the right side digastric region oozing some purulent fluid.  Left neck is clear.  Oral cavity shows teeth in a poor state of repair.  Well-developed well-nourished patient in NAD. HEENT reveals PERLA, EOMI, discs not visualized.  Oral cavity is clear. No oral mucosal lesions are identified. Neck is clear without evidence of cervical or supraclavicular adenopathy. Lungs are clear to A&P. Cardiac examination is essentially unremarkable with regular rate and rhythm without murmur rub or thrill. Abdomen is benign with no organomegaly or masses noted. Motor sensory and DTR levels are equal and symmetric in the upper and lower extremities. Cranial nerves II through XII are grossly intact. Proprioception is intact. No peripheral adenopathy or edema is identified. No motor or sensory levels are noted. Crude visual fields are within normal range.  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  Will also go ahead with simulation to his head and neck.  I would plan on delivering 70 Gray to the large hypermetabolic right neck masses as well as his piriform sinus.  Risks and benefits of treatment including oral mucositis fatigue  xerostomia loss of taste skin reaction alteration of blood counts all were reviewed in detail with the patient.  I have tentatively set up simulation for next week hopefully can have his extractions prior to initiating treatment.  Patient comprehends my recommendations well.  There will be extra effort by both professional staff as well as technical staff to coordinate and manage concurrent chemoradiation and ensuing side effects during his treatments. I would choose IMRT treatment planning and delivery to spare critical structures such as his salivary glands spinal cord and allow me to dose pain for radiation doses to the involved nodes as well as treat his uninvolved nodes.  I would like to take this opportunity to thank you for allowing me to participate in the care of your patient.Carmina Miller, MD

## 2022-06-07 ENCOUNTER — Inpatient Hospital Stay: Payer: Medicare Other

## 2022-06-07 NOTE — Progress Notes (Signed)
Nutrition Assessment:  New head and neck patient  72 year old male with stage IV squamous cell carcinoma of head and neck (right neck mass), primary lesion not indicated on CT.  Planning concurrent chemotherapy and radiation.    Met with patient in clinic.  Reports that his appetite has decreased over the last month.  "I am forcing myself to eat."  Drinking 2-3, ensure shakes a day.  This morning reports 3 strips of bacon, 2 whole, buttered English muffins, 2 mandarin oranges, cheese slices, coffee, V-8 juice.  Tends to graze during the day.  Prepares frozen meals or meals heated on stove top (microwave hamburger patty and potato salad).  Snacks on chips, popcorn, ice cream.  Three -four nights a week will have salmon with rice and canned vegetables.  Denies trouble swallowing.  Planning teeth extraction next week.  Not avoiding foods due to poor dentition.  Reports some issues with constipation but improved with stool softners.     Medications: reviewed  Labs: Na 132  Anthropometrics:   Height: 69 inches Weight: 131 lb 8 oz on 5/20 UBW: 140 lb per patient about a month ago BMI: 19  6% weight loss in the last month, significant   Estimated Energy Needs  Kcals: 1610-9604 Protein: 85-103 g  Fluid: > 1700 ml  NUTRITION DIAGNOSIS: Unintentional weight loss related to ?cancer as evidenced by 6% weight loss in the last month and decreased intake    INTERVENTION:  Discussed importance of weight maintenance during treatment Discussed ways to add calories and protein in diet. High Calorie, High protein diet handout provided Recommend 350 calorie oral nutrition supplement Recommend evaluation by SLP with planning radiation. Message sent to MD Contact information provided    MONITORING, EVALUATION, GOAL: weight trends, intake   NEXT VISIT: Thursday, June 20th after radiation  Osker Ayoub B. Freida Busman, RD, LDN Registered Dietitian [phone]

## 2022-06-11 ENCOUNTER — Encounter: Payer: Self-pay | Admitting: Oncology

## 2022-06-11 ENCOUNTER — Other Ambulatory Visit: Payer: Self-pay | Admitting: Oncology

## 2022-06-11 ENCOUNTER — Ambulatory Visit
Admission: RE | Admit: 2022-06-11 | Discharge: 2022-06-11 | Disposition: A | Discharge status: Home / Self Care | Payer: Medicare Other | Source: Ambulatory Visit | Attending: Radiation Oncology | Admitting: Radiation Oncology

## 2022-06-11 ENCOUNTER — Other Ambulatory Visit: Payer: Self-pay | Admitting: *Deleted

## 2022-06-11 DIAGNOSIS — C760 Malignant neoplasm of head, face and neck: Secondary | ICD-10-CM | POA: Diagnosis present

## 2022-06-11 DIAGNOSIS — C4442 Squamous cell carcinoma of skin of scalp and neck: Secondary | ICD-10-CM | POA: Insufficient documentation

## 2022-06-11 MED ORDER — ONDANSETRON HCL 8 MG PO TABS
8.0000 mg | ORAL_TABLET | Freq: Three times a day (TID) | ORAL | 1 refills | Status: DC | PRN
Start: 2022-06-11 — End: 2023-03-28

## 2022-06-11 MED ORDER — PROCHLORPERAZINE MALEATE 10 MG PO TABS
10.0000 mg | ORAL_TABLET | Freq: Four times a day (QID) | ORAL | 1 refills | Status: DC | PRN
Start: 2022-06-11 — End: 2023-03-28

## 2022-06-11 NOTE — Progress Notes (Signed)
START ON PATHWAY REGIMEN - Head and Neck     A cycle is every 7 days:     Cisplatin   **Always confirm dose/schedule in your pharmacy ordering system**  Patient Characteristics: Oropharynx, HPV Negative/Unknown, Preoperative or Nonsurgical Candidate (Clinical Staging), Stage IVA/B Disease Classification: Oropharynx HPV Status: Did Not Order Test Therapeutic Status: Preoperative or Nonsurgical Candidate (Clinical Staging) AJCC T Category: cTX AJCC 8 Stage Grouping: IVA AJCC N Category: cNX AJCC M Category: cM0 Intent of Therapy: Curative Intent, Discussed with Patient

## 2022-06-13 ENCOUNTER — Other Ambulatory Visit: Payer: Self-pay

## 2022-06-19 DIAGNOSIS — C760 Malignant neoplasm of head, face and neck: Secondary | ICD-10-CM | POA: Diagnosis not present

## 2022-06-19 MED FILL — Dexamethasone Sodium Phosphate Inj 100 MG/10ML: INTRAMUSCULAR | Qty: 1 | Status: AC

## 2022-06-19 MED FILL — Fosaprepitant Dimeglumine For IV Infusion 150 MG (Base Eq): INTRAVENOUS | Qty: 5 | Status: AC

## 2022-06-20 ENCOUNTER — Encounter: Payer: Self-pay | Admitting: Oncology

## 2022-06-20 ENCOUNTER — Inpatient Hospital Stay: Payer: Medicare Other

## 2022-06-20 ENCOUNTER — Ambulatory Visit
Admission: RE | Admit: 2022-06-20 | Discharge: 2022-06-20 | Disposition: A | Discharge status: Home / Self Care | Payer: Medicare Other | Source: Ambulatory Visit | Attending: Radiation Oncology | Admitting: Radiation Oncology

## 2022-06-20 ENCOUNTER — Inpatient Hospital Stay (HOSPITAL_BASED_OUTPATIENT_CLINIC_OR_DEPARTMENT_OTHER): Payer: Medicare Other | Admitting: Oncology

## 2022-06-20 DIAGNOSIS — R630 Anorexia: Secondary | ICD-10-CM | POA: Insufficient documentation

## 2022-06-20 DIAGNOSIS — S1190XA Unspecified open wound of unspecified part of neck, initial encounter: Secondary | ICD-10-CM | POA: Insufficient documentation

## 2022-06-20 DIAGNOSIS — C4442 Squamous cell carcinoma of skin of scalp and neck: Secondary | ICD-10-CM

## 2022-06-20 DIAGNOSIS — Z5111 Encounter for antineoplastic chemotherapy: Secondary | ICD-10-CM | POA: Insufficient documentation

## 2022-06-20 DIAGNOSIS — F17210 Nicotine dependence, cigarettes, uncomplicated: Secondary | ICD-10-CM | POA: Insufficient documentation

## 2022-06-20 DIAGNOSIS — R634 Abnormal weight loss: Secondary | ICD-10-CM | POA: Insufficient documentation

## 2022-06-20 DIAGNOSIS — D649 Anemia, unspecified: Secondary | ICD-10-CM | POA: Insufficient documentation

## 2022-06-20 DIAGNOSIS — D72829 Elevated white blood cell count, unspecified: Secondary | ICD-10-CM | POA: Insufficient documentation

## 2022-06-20 DIAGNOSIS — Z823 Family history of stroke: Secondary | ICD-10-CM | POA: Insufficient documentation

## 2022-06-20 DIAGNOSIS — Z9049 Acquired absence of other specified parts of digestive tract: Secondary | ICD-10-CM | POA: Insufficient documentation

## 2022-06-20 DIAGNOSIS — C760 Malignant neoplasm of head, face and neck: Secondary | ICD-10-CM | POA: Insufficient documentation

## 2022-06-20 LAB — BASIC METABOLIC PANEL - CANCER CENTER ONLY
Anion gap: 9 (ref 5–15)
BUN: 28 mg/dL — ABNORMAL HIGH (ref 8–23)
CO2: 25 mmol/L (ref 22–32)
Calcium: 9.6 mg/dL (ref 8.9–10.3)
Chloride: 104 mmol/L (ref 98–111)
Creatinine: 0.81 mg/dL (ref 0.61–1.24)
GFR, Estimated: >60 mL/min (ref >60)
Glucose, Bld: 187 mg/dL — ABNORMAL HIGH (ref 70–99)
Potassium: 4.1 mmol/L (ref 3.5–5.1)
Sodium: 138 mmol/L (ref 135–145)

## 2022-06-20 LAB — CBC WITH DIFFERENTIAL (CANCER CENTER ONLY)
Abs Immature Granulocytes: 0.22 10*3/uL — ABNORMAL HIGH (ref 0.00–0.07)
Basophils Absolute: 0.2 10*3/uL — ABNORMAL HIGH (ref 0.0–0.1)
Basophils Relative: 1 %
Eosinophils Absolute: 1 10*3/uL — ABNORMAL HIGH (ref 0.0–0.5)
Eosinophils Relative: 6 %
HCT: 40.6 % (ref 39.0–52.0)
Hemoglobin: 13.6 g/dL (ref 13.0–17.0)
Immature Granulocytes: 1 %
Lymphocytes Relative: 16 %
Lymphs Abs: 2.5 10*3/uL (ref 0.7–4.0)
MCH: 31.8 pg (ref 26.0–34.0)
MCHC: 33.5 g/dL (ref 30.0–36.0)
MCV: 94.9 fL (ref 80.0–100.0)
Monocytes Absolute: 1.3 10*3/uL — ABNORMAL HIGH (ref 0.1–1.0)
Monocytes Relative: 8 %
Neutro Abs: 11 10*3/uL — ABNORMAL HIGH (ref 1.7–7.7)
Neutrophils Relative %: 68 %
Platelet Count: 392 10*3/uL (ref 150–400)
RBC: 4.28 MIL/uL (ref 4.22–5.81)
RDW: 13.8 % (ref 11.5–15.5)
WBC Count: 16.2 10*3/uL — ABNORMAL HIGH (ref 4.0–10.5)
nRBC: 0 % (ref 0.0–0.2)

## 2022-06-20 LAB — MAGNESIUM: Magnesium: 2.1 mg/dL (ref 1.7–2.4)

## 2022-06-20 MED ORDER — SODIUM CHLORIDE 0.9 % IV SOLN
Freq: Once | INTRAVENOUS | Status: AC
  Filled 2022-06-20: qty 250

## 2022-06-20 MED ORDER — SODIUM CHLORIDE 0.9 % IV SOLN
40.0000 mg/m2 | Freq: Once | INTRAVENOUS | Status: AC
  Administered 2022-06-20: 68 mg via INTRAVENOUS
  Filled 2022-06-20: qty 67.68

## 2022-06-20 MED ORDER — SODIUM CHLORIDE 0.9 % IV SOLN
150.0000 mg | Freq: Once | INTRAVENOUS | Status: AC
  Administered 2022-06-20: 150 mg via INTRAVENOUS
  Filled 2022-06-20: qty 150

## 2022-06-20 MED ORDER — SODIUM CHLORIDE 0.9 % IV SOLN
10.0000 mg | Freq: Once | INTRAVENOUS | Status: AC
  Administered 2022-06-20: 10 mg via INTRAVENOUS
  Filled 2022-06-20: qty 10

## 2022-06-20 MED ORDER — PALONOSETRON HCL INJECTION 0.25 MG/5ML
0.2500 mg | Freq: Once | INTRAVENOUS | Status: AC
  Administered 2022-06-20: 0.25 mg via INTRAVENOUS
  Filled 2022-06-20: qty 5

## 2022-06-20 MED ORDER — POTASSIUM CHLORIDE IN NACL 20-0.9 MEQ/L-% IV SOLN
Freq: Once | INTRAVENOUS | Status: AC
  Filled 2022-06-20: qty 1000

## 2022-06-20 MED ORDER — MAGNESIUM SULFATE 2 GM/50ML IV SOLN
2.0000 g | Freq: Once | INTRAVENOUS | Status: AC
  Administered 2022-06-20: 2 g via INTRAVENOUS
  Filled 2022-06-20: qty 50

## 2022-06-20 NOTE — Progress Notes (Signed)
Patient seems to think that the Ensures are giving a little bit of constipation.

## 2022-06-20 NOTE — Patient Instructions (Signed)
[place] [facility]  Discharge Instructions: Thank you for choosing [facility] to provide your oncology and hematology care.  If you have a lab appointment with the Cancer Center, please go directly to the Cancer Center and check in at the registration area.  Wear comfortable clothing and clothing appropriate for easy access to any Portacath or PICC line.   We strive to give you quality time with your provider. You may need to reschedule your appointment if you arrive late (15 or more minutes).  Arriving late affects you and other patients whose appointments are after yours.  Also, if you miss three or more appointments without notifying the office, you may be dismissed from the clinic at the provider's discretion.      For prescription refill requests, have your pharmacy contact our office and allow 72 hours for refills to be completed.    Today you received the following chemotherapy and/or immunotherapy agents Cisplatin    To help prevent nausea and vomiting after your treatment, we encourage you to take your nausea medication as directed.  BELOW ARE SYMPTOMS THAT SHOULD BE REPORTED IMMEDIATELY: *FEVER GREATER THAN 100.4 F (38 C) OR HIGHER *CHILLS OR SWEATING *NAUSEA AND VOMITING THAT IS NOT CONTROLLED WITH YOUR NAUSEA MEDICATION *UNUSUAL SHORTNESS OF BREATH *UNUSUAL BRUISING OR BLEEDING *URINARY PROBLEMS (pain or burning when urinating, or frequent urination) *BOWEL PROBLEMS (unusual diarrhea, constipation, pain near the anus) TENDERNESS IN MOUTH AND THROAT WITH OR WITHOUT PRESENCE OF ULCERS (sore throat, sores in mouth, or a toothache) UNUSUAL RASH, SWELLING OR PAIN  UNUSUAL VAGINAL DISCHARGE OR ITCHING   Items with * indicate a potential emergency and should be followed up as soon as possible or go to the Emergency Department if any problems should occur.  Please show the CHEMOTHERAPY ALERT CARD or IMMUNOTHERAPY ALERT CARD at check-in to  the Emergency Department and triage nurse.  Should you have questions after your visit or need to cancel or reschedule your appointment, please contact [place] [facility]  [phone] and follow the prompts.  Office hours are 8:00 a.m. to 4:30 p.m. Monday - Friday. Please note that voicemails left after 4:00 p.m. may not be returned until the following business day.  We are closed weekends and major holidays. You have access to a nurse at all times for urgent questions. Please call the main number to the clinic [phone] and follow the prompts.  For any non-urgent questions, you may also contact your provider using MyChart. We now offer e-Visits for anyone 50 and older to request care online for non-urgent symptoms. For details visit [email or web address].   Also download the MyChart app! Go to the app store, search "MyChart", open the app, select [place], and log in with your MyChart username and password.    Cisplatin Injection What is this medication? CISPLATIN (SIS pla tin) treats some types of cancer. It works by slowing down the growth of cancer cells. This medicine may be used for other purposes; ask your health care provider or pharmacist if you have questions. COMMON BRAND NAME(S): Platinol, Platinol -AQ What should I tell my care team before I take this medication? They need to know if you have any of these conditions: Eye disease, vision problems Hearing problems Kidney disease Low blood counts, such as low white cells, platelets, or red blood cells Tingling of the fingers or toes, or other nerve disorder An unusual or allergic reaction to cisplatin, carboplatin, oxaliplatin, other  medications, foods, dyes, or preservatives If you or your partner are pregnant or trying to get pregnant Breast-feeding How should I use this medication? This medication is injected into a vein. It is given by your care team in a hospital or clinic setting. Talk to  your care team about the use of this medication in children. Special care may be needed. Overdosage: If you think you have taken too much of this medicine contact a poison control center or emergency room at once. NOTE: This medicine is only for you. Do not share this medicine with others. What if I miss a dose? Keep appointments for follow-up doses. It is important not to miss your dose. Call your care team if you are unable to keep an appointment. What may interact with this medication? Do not take this medication with any of the following: Live virus vaccines This medication may also interact with the following: Certain antibiotics, such as amikacin, gentamicin, neomycin, polymyxin B, streptomycin, tobramycin, vancomycin Foscarnet This list may not describe all possible interactions. Give your health care provider a list of all the medicines, herbs, non-prescription drugs, or dietary supplements you use. Also tell them if you smoke, drink alcohol, or use illegal drugs. Some items may interact with your medicine. What should I watch for while using this medication? Your condition will be monitored carefully while you are receiving this medication. You may need blood work done while taking this medication. This medication may make you feel generally unwell. This is not uncommon, as chemotherapy can affect healthy cells as well as cancer cells. Report any side effects. Continue your course of treatment even though you feel ill unless your care team tells you to stop. This medication may increase your risk of getting an infection. Call your care team for advice if you get a fever, chills, sore throat, or other symptoms of a cold or flu. Do not treat yourself. Try to avoid being around people who are sick. Avoid taking medications that contain aspirin, acetaminophen, ibuprofen, naproxen, or ketoprofen unless instructed by your care team. These medications may hide a fever. This medication may increase  your risk to bruise or bleed. Call your care team if you notice any unusual bleeding. Be careful brushing or flossing your teeth or using a toothpick because you may get an infection or bleed more easily. If you have any dental work done, tell your dentist you are receiving this medication. Drink fluids as directed while you are taking this medication. This will help protect your kidneys. Call your care team if you get diarrhea. Do not treat yourself. Talk to your care team if you or your partner wish to become pregnant or think you might be pregnant. This medication can cause serious birth defects if taken during pregnancy and for 14 months after the last dose. A negative pregnancy test is required before starting this medication. A reliable form of contraception is recommended while taking this medication and for 14 months after the last dose. Talk to your care team about effective forms of contraception. Do not father a child while taking this medication and for 11 months after the last dose. Use a condom during sex during this time period. Do not breast-feed while taking this medication. This medication may cause infertility. Talk to your care team if you are concerned about your fertility. What side effects may I notice from receiving this medication? Side effects that you should report to your care team as soon as possible: Allergic reactions--skin rash,  itching, hives, swelling of the face, lips, tongue, or throat Eye pain, change in vision, vision loss Hearing loss, ringing in ears Infection--fever, chills, cough, sore throat, wounds that don't heal, pain or trouble when passing urine, general feeling of discomfort or being unwell Kidney injury--decrease in the amount of urine, swelling of the ankles, hands, or feet Low red blood cell level--unusual weakness or fatigue, dizziness, headache, trouble breathing Painful swelling, warmth, or redness of the skin, blisters or sores at the infusion  site Pain, tingling, or numbness in the hands or feet Unusual bruising or bleeding Side effects that usually do not require medical attention (report to your care team if they continue or are bothersome): Hair loss Nausea Vomiting This list may not describe all possible side effects. Call your doctor for medical advice about side effects. You may report side effects to FDA at 1-800-FDA-1088. Where should I keep my medication? This medication is given in a hospital or clinic. It will not be stored at home. NOTE: This sheet is a summary. It may not cover all possible information. If you have questions about this medicine, talk to your doctor, pharmacist, or health care provider.  2024 Elsevier/Gold Standard (2021-05-04 00:00:00)

## 2022-06-20 NOTE — Progress Notes (Signed)
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  INTERVAL HISTORY: Patient returns to clinic today for further evaluation and initiation of cycle 1 of weekly cisplatin.  He will initiate XRT next week.  He continues to have a poor appetite, but is making efforts to increase his calorie count.  He otherwise feels well. He does not complain of any pain or dysphagia.   He has no neurologic complaints.  He denies any recent fevers or illnesses.  He has no chest pain, shortness of breath, cough, or hemoptysis.  He denies any nausea, vomiting, constipation, or diarrhea.  He has no urinary complaints.  Patient offers no further specific complaints today.  REVIEW OF SYSTEMS:   Review of Systems  Constitutional:  Positive for weight loss. Negative for fever and malaise/fatigue.  HENT:  Negative for sore throat.   Respiratory: Negative.  Negative for cough, hemoptysis and shortness of breath.   Cardiovascular: Negative.  Negative for chest pain and leg swelling.  Gastrointestinal: Negative.  Negative for abdominal pain.  Genitourinary: Negative.  Negative for dysuria.  Musculoskeletal: Negative.  Negative for back pain.  Skin: Negative.  Negative for rash.  Neurological: Negative.  Negative for dizziness, focal weakness, weakness and headaches.  Psychiatric/Behavioral: Negative.  The patient is not nervous/anxious.     As per HPI. Otherwise, a complete review of systems is negative.  PAST MEDICAL HISTORY: History reviewed. No pertinent past medical history.  PAST SURGICAL HISTORY: Past Surgical History:  Procedure Laterality Date   APPENDECTOMY  06/22/20   JOINT REPLACEMENT  02/26/2003   hip   LAPAROSCOPIC APPENDECTOMY N/A 06/23/2020   Procedure: APPENDECTOMY LAPAROSCOPIC;  Surgeon:  Duanne Guess, MD;  Location: [facility] ORS;  Service: General;  Laterality: N/A;   TOTAL HIP ARTHROPLASTY Right     FAMILY HISTORY: Family History  Problem Relation Age of Onset   Alzheimer's disease Mother    Stroke Father     ADVANCED DIRECTIVES (Y/N):  N  HEALTH MAINTENANCE: Social History   Tobacco Use   Smoking status: Every Day    Packs/day: .5    Types: Cigarettes   Smokeless tobacco: Never  Vaping Use   Vaping Use: Never used  Substance Use Topics   Alcohol use: Yes    Alcohol/week: 5.0 standard drinks of alcohol    Types: 5 Shots of liquor per week   Drug use: Not Currently     Colonoscopy:  PAP:  Bone density:  Lipid panel:  No Known Allergies  Current Outpatient Medications  Medication Sig Dispense Refill   ondansetron (ZOFRAN) 8 MG tablet Take 1 tablet (8 mg total) by mouth every 8 (eight) hours as needed for nausea or vomiting. Start on the third day after cisplatin. 60 tablet 1   prochlorperazine (COMPAZINE) 10 MG tablet Take 1 tablet (10 mg total) by mouth every 6 (six) hours as needed (Nausea or vomiting). 60 tablet 1   amoxicillin-clavulanate (AUGMENTIN) 875-125 MG tablet Take 1 tablet by mouth 2 (two) times daily. (Patient not taking: Reported on 06/20/2022) 20 tablet 0   No current facility-administered medications for this visit.   Facility-Administered Medications Ordered in Other Visits  Medication Dose Route Frequency Provider Last Rate Last Admin   CISplatin (PLATINOL) 68 mg in sodium chloride 0.9 % 250 mL chemo infusion  40  mg/m2 (Treatment Plan Recorded) Intravenous Once Jeralyn Ruths, MD       dexamethasone (DECADRON) 10 mg in sodium chloride 0.9 % 50 mL IVPB  10 mg Intravenous Once Jeralyn Ruths, MD       fosaprepitant (EMEND) 150 mg in sodium chloride 0.9 % 145 mL IVPB  150 mg Intravenous Once Jeralyn Ruths, MD       magnesium sulfate IVPB 2 g 50 mL  2 g Intravenous Once Jeralyn Ruths, MD 50 mL/hr at 06/20/22 0928 2 g  at 06/20/22 0928   palonosetron (ALOXI) injection 0.25 mg  0.25 mg Intravenous Once Jeralyn Ruths, MD        OBJECTIVE: Vitals:   06/20/22 0826  BP: (!) 146/62  Pulse: 82  Temp: (!) 97 F (36.1 C)  SpO2: 100%     Body mass index is 19.55 kg/m.    ECOG FS:0 - Asymptomatic  General: Well-developed, well-nourished, no acute distress. Eyes: Pink conjunctiva, anicteric sclera. HEENT: Normocephalic, moist mucous membranes.  Easily palpable right neck mass. Lungs: No audible wheezing or coughing. Heart: Regular rate and rhythm. Abdomen: Soft, nontender, no obvious distention. Musculoskeletal: No edema, cyanosis, or clubbing. Neuro: Alert, answering all questions appropriately. Cranial nerves grossly intact. Skin: No rashes or petechiae noted. Psych: Normal affect.  LAB RESULTS:  Lab Results  Component Value Date   NA 138 06/20/2022   K 4.1 06/20/2022   CL 104 06/20/2022   CO2 25 06/20/2022   GLUCOSE 187 (H) 06/20/2022   BUN 28 (H) 06/20/2022   CREATININE 0.81 06/20/2022   CALCIUM 9.6 06/20/2022   PROT 7.7 05/28/2022   ALBUMIN 4.2 05/28/2022   AST 18 05/28/2022   ALT 15 05/28/2022   ALKPHOS 69 05/28/2022   BILITOT 0.6 05/28/2022   GFRNONAA >60 06/20/2022    Lab Results  Component Value Date   WBC 16.2 (H) 06/20/2022   NEUTROABS 11.0 (H) 06/20/2022   HGB 13.6 06/20/2022   HCT 40.6 06/20/2022   MCV 94.9 06/20/2022   PLT 392 06/20/2022     STUDIES: NM PET Image Initial (PI) Skull Base To Thigh (F-18 FDG)  Result Date: 05/28/2022 CLINICAL DATA:  Initial treatment strategy for head neck carcinoma. RIGHT neck mass. Squamous cell carcinoma on biopsy. EXAM: NUCLEAR MEDICINE PET SKULL BASE TO THIGH TECHNIQUE: 7.8 mCi F-18 FDG was injected intravenously. Full-ring PET imaging was performed from the skull base to thigh after the radiotracer. CT data was obtained and used for attenuation correction and anatomic localization. Fasting blood glucose: 111 mg/dl COMPARISON:   Neck CT 40/98/1191 FINDINGS: Mediastinal blood pool activity: SUV max 2.3 Liver activity: SUV max NA NECK: Large superficial RIGHT neck mass measuring 4.7 x 4.1 cm intensely hypermetabolic SUV max 24. Small hypermetabolic lymph node just medial and superior to the dominant mass measures 10 mm (25/4 )with equal radiotracer activity to the dominant mass. Single small hypermetabolic focus in the RIGHT hypopharynx/piriform sinus on image 34. Lesion is small and not evident on the noncontrast CT. There was subtle enhancement through this region on comparison diagnostic neck CT scan. Symmetric activity associated with the glottis is favored physiologic. Incidental CT findings: None CHEST: Hypermetabolic RIGHT axillary lymph node measures 11 mm short axis (image 61/4) and has moderate metabolic activity SUV max equal 3.8. Several smaller adjacent lymph nodes have mild activity - rounded node on image 58 measuring 10 mm. No hypermetabolic supraclavicular nodes. No hypermetabolic mediastinal lymph nodes or hilar lymph nodes. Incidental CT findings: .  Single small nodule in the anterior LEFT upper lobe measures 4 mm (image 50/4). No associated radiotracer activity. Sub solid nodule at the RIGHT lung apex measures 8 mm (47/4) also with no metabolic activity. ABDOMEN/PELVIS: No abnormal hypermetabolic activity within the liver, pancreas, adrenal glands, or spleen. No hypermetabolic lymph nodes in the abdomen or pelvis. RIGHT inguinal lymph node with SUV max equal 4 on image 163. Lymph node measures 10 mm. Incidental CT findings: Multiple bladder diverticula. Large RIGHT renal hilum calculus. RIGHT renal pelvis is dilated. Potential UPJ obstruction on the RIGHT. Minimal change from comparison CT SKELETON: No focal hypermetabolic activity to suggest skeletal metastasis. Incidental CT findings: None. IMPRESSION: 1. Large intensely hypermetabolic superficial RIGHT neck mass consistent with primary head neck carcinoma nodal  metastasis. 2. Smaller hypermetabolic level 2 lymph node in the RIGHT neck superior to the dominant mass/node. 3. Small hypermetabolic focus in the RIGHT hypopharynx/piriform sinus. Potential primary lesion. 4. Hypermetabolic borderline enlarged RIGHT axillary lymph nodes and RIGHT inguinal node. Indeterminate finding. Consider tissue sampling of the RIGHT axillary node. 5. Small pulmonary nodules in the upper lobes without metabolic activity. Recommend follow-up on routine surveillance. 6. Large RIGHT renal hilum calculus with dilated renal pelvis. Potential UPJ stenosis on the RIGHT. Extensive bladder diverticula. Chronic findings similar to CT 06/22/2020. Electronically Signed   By: Genevive Bi M.D.   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  CT does not distinctly indicate a primary lesion.  PET scan results from May 27, 2022 reviewed independently which large intensely hypermetabolic right neck mass consistent with nodal metastasis.  Patient also noted to have a right hypopharynx/piriform sinus focus which is possibly the primary lesion.  He also was noted to have indeterminate right axillary and right inguinal lymph nodes, unlikely metastasis but will monitor closely.   Patient does not require port placement, but would consider one in the future if IV access became an issue.  Proceed with cycle 1 of treatment today.  Return to clinic in 1 week for further evaluation and consideration of cycle 2. Leukocytosis: Likely reactive, monitor.  I spent a total of 30 minutes reviewing chart data, face-to-face evaluation with the patient, counseling and coordination of care as detailed above.   Patient expressed understanding and was in agreement with this plan. He also understands that  He can call clinic at any time with any questions, concerns, or complaints.    Cancer Staging  Squamous cell carcinoma of head and neck Staging form: Cervical Lymph Nodes and Unknown Primary Tumors of the Head and Neck, AJCC 8th Edition - Clinical: Stage IVA (cT0, cN2c, cM0) - Signed by Jeralyn Ruths, MD on 06/20/2022   Jeralyn Ruths, MD   06/20/2022 10:00 AM

## 2022-06-24 ENCOUNTER — Other Ambulatory Visit: Payer: Self-pay

## 2022-06-24 ENCOUNTER — Ambulatory Visit
Admission: RE | Admit: 2022-06-24 | Discharge: 2022-06-24 | Disposition: A | Discharge status: Home / Self Care | Payer: Medicare Other | Source: Ambulatory Visit | Attending: Radiation Oncology | Admitting: Radiation Oncology

## 2022-06-24 DIAGNOSIS — C760 Malignant neoplasm of head, face and neck: Secondary | ICD-10-CM | POA: Diagnosis not present

## 2022-06-24 LAB — RAD ONC ARIA SESSION SUMMARY
Course Elapsed Days: 0
Plan Fractions Treated to Date: 1
Plan Prescribed Dose Per Fraction: 2 Gy
Plan Total Fractions Prescribed: 35
Plan Total Prescribed Dose: 70 Gy
Reference Point Dosage Given to Date: 2 Gy
Reference Point Session Dosage Given: 2 Gy
Session Number: 1

## 2022-06-25 ENCOUNTER — Ambulatory Visit
Admission: RE | Admit: 2022-06-25 | Discharge: 2022-06-25 | Disposition: A | Discharge status: Home / Self Care | Payer: Medicare Other | Source: Ambulatory Visit | Attending: Radiation Oncology | Admitting: Radiation Oncology

## 2022-06-25 ENCOUNTER — Other Ambulatory Visit: Payer: Self-pay

## 2022-06-25 DIAGNOSIS — C760 Malignant neoplasm of head, face and neck: Secondary | ICD-10-CM | POA: Diagnosis not present

## 2022-06-25 LAB — RAD ONC ARIA SESSION SUMMARY
Course Elapsed Days: 1
Plan Fractions Treated to Date: 2
Plan Prescribed Dose Per Fraction: 2 Gy
Plan Total Fractions Prescribed: 35
Plan Total Prescribed Dose: 70 Gy
Reference Point Dosage Given to Date: 4 Gy
Reference Point Session Dosage Given: 2 Gy
Session Number: 2

## 2022-06-26 ENCOUNTER — Ambulatory Visit
Admission: RE | Admit: 2022-06-26 | Discharge: 2022-06-26 | Disposition: A | Discharge status: Home / Self Care | Payer: Medicare Other | Source: Ambulatory Visit | Attending: Radiation Oncology | Admitting: Radiation Oncology

## 2022-06-26 ENCOUNTER — Other Ambulatory Visit: Payer: Self-pay

## 2022-06-26 DIAGNOSIS — C760 Malignant neoplasm of head, face and neck: Secondary | ICD-10-CM | POA: Diagnosis not present

## 2022-06-26 LAB — RAD ONC ARIA SESSION SUMMARY
Course Elapsed Days: 2
Plan Fractions Treated to Date: 3
Plan Prescribed Dose Per Fraction: 2 Gy
Plan Total Fractions Prescribed: 35
Plan Total Prescribed Dose: 70 Gy
Reference Point Dosage Given to Date: 6 Gy
Reference Point Session Dosage Given: 2 Gy
Session Number: 3

## 2022-06-27 ENCOUNTER — Inpatient Hospital Stay: Payer: Medicare Other

## 2022-06-27 ENCOUNTER — Inpatient Hospital Stay (HOSPITAL_BASED_OUTPATIENT_CLINIC_OR_DEPARTMENT_OTHER): Payer: Medicare Other | Admitting: Oncology

## 2022-06-27 ENCOUNTER — Ambulatory Visit
Admission: RE | Admit: 2022-06-27 | Discharge: 2022-06-27 | Disposition: A | Discharge status: Home / Self Care | Payer: Medicare Other | Source: Ambulatory Visit | Attending: Radiation Oncology | Admitting: Radiation Oncology

## 2022-06-27 ENCOUNTER — Encounter: Payer: Self-pay | Admitting: Oncology

## 2022-06-27 ENCOUNTER — Other Ambulatory Visit: Payer: Self-pay

## 2022-06-27 VITALS — BP 145/72 | HR 84 | Temp 96.5°F

## 2022-06-27 DIAGNOSIS — C4442 Squamous cell carcinoma of skin of scalp and neck: Secondary | ICD-10-CM

## 2022-06-27 DIAGNOSIS — C760 Malignant neoplasm of head, face and neck: Secondary | ICD-10-CM | POA: Diagnosis not present

## 2022-06-27 LAB — BASIC METABOLIC PANEL - CANCER CENTER ONLY
Anion gap: 11 (ref 5–15)
BUN: 32 mg/dL — ABNORMAL HIGH (ref 8–23)
CO2: 25 mmol/L (ref 22–32)
Calcium: 9.7 mg/dL (ref 8.9–10.3)
Chloride: 104 mmol/L (ref 98–111)
Creatinine: 0.76 mg/dL (ref 0.61–1.24)
GFR, Estimated: >60 mL/min (ref >60)
Glucose, Bld: 104 mg/dL — ABNORMAL HIGH (ref 70–99)
Potassium: 3.7 mmol/L (ref 3.5–5.1)
Sodium: 140 mmol/L (ref 135–145)

## 2022-06-27 LAB — CBC WITH DIFFERENTIAL (CANCER CENTER ONLY)
Abs Immature Granulocytes: 0.24 10*3/uL — ABNORMAL HIGH (ref 0.00–0.07)
Basophils Absolute: 0.1 10*3/uL (ref 0.0–0.1)
Basophils Relative: 1 %
Eosinophils Absolute: 0.3 10*3/uL (ref 0.0–0.5)
Eosinophils Relative: 2 %
HCT: 41.7 % (ref 39.0–52.0)
Hemoglobin: 13.7 g/dL (ref 13.0–17.0)
Immature Granulocytes: 2 %
Lymphocytes Relative: 21 %
Lymphs Abs: 2.5 10*3/uL (ref 0.7–4.0)
MCH: 31.2 pg (ref 26.0–34.0)
MCHC: 32.9 g/dL (ref 30.0–36.0)
MCV: 95 fL (ref 80.0–100.0)
Monocytes Absolute: 1.1 10*3/uL — ABNORMAL HIGH (ref 0.1–1.0)
Monocytes Relative: 9 %
Neutro Abs: 7.9 10*3/uL — ABNORMAL HIGH (ref 1.7–7.7)
Neutrophils Relative %: 65 %
Platelet Count: 390 10*3/uL (ref 150–400)
RBC: 4.39 MIL/uL (ref 4.22–5.81)
RDW: 13.7 % (ref 11.5–15.5)
WBC Count: 12.1 10*3/uL — ABNORMAL HIGH (ref 4.0–10.5)
nRBC: 0 % (ref 0.0–0.2)

## 2022-06-27 LAB — RAD ONC ARIA SESSION SUMMARY
Course Elapsed Days: 3
Plan Fractions Treated to Date: 4
Plan Prescribed Dose Per Fraction: 2 Gy
Plan Total Fractions Prescribed: 35
Plan Total Prescribed Dose: 70 Gy
Reference Point Dosage Given to Date: 8 Gy
Reference Point Session Dosage Given: 2 Gy
Session Number: 4

## 2022-06-27 LAB — MAGNESIUM: Magnesium: 2.2 mg/dL (ref 1.7–2.4)

## 2022-06-27 MED ORDER — SODIUM CHLORIDE 0.9 % IV SOLN
10.0000 mg | Freq: Once | INTRAVENOUS | Status: AC
  Administered 2022-06-27: 10 mg via INTRAVENOUS
  Filled 2022-06-27: qty 10

## 2022-06-27 MED ORDER — POTASSIUM CHLORIDE IN NACL 20-0.9 MEQ/L-% IV SOLN
Freq: Once | INTRAVENOUS | Status: AC
  Filled 2022-06-27: qty 1000

## 2022-06-27 MED ORDER — SODIUM CHLORIDE 0.9 % IV SOLN
150.0000 mg | Freq: Once | INTRAVENOUS | Status: AC
  Administered 2022-06-27: 150 mg via INTRAVENOUS
  Filled 2022-06-27: qty 150

## 2022-06-27 MED ORDER — MAGNESIUM SULFATE 2 GM/50ML IV SOLN
2.0000 g | Freq: Once | INTRAVENOUS | Status: AC
  Administered 2022-06-27: 2 g via INTRAVENOUS
  Filled 2022-06-27: qty 50

## 2022-06-27 MED ORDER — SODIUM CHLORIDE 0.9 % IV SOLN
Freq: Once | INTRAVENOUS | Status: AC
  Filled 2022-06-27: qty 250

## 2022-06-27 MED ORDER — SODIUM CHLORIDE 0.9 % IV SOLN
40.0000 mg/m2 | Freq: Once | INTRAVENOUS | Status: AC
  Administered 2022-06-27: 68 mg via INTRAVENOUS
  Filled 2022-06-27: qty 68

## 2022-06-27 MED ORDER — PALONOSETRON HCL INJECTION 0.25 MG/5ML
0.2500 mg | Freq: Once | INTRAVENOUS | Status: AC
  Administered 2022-06-27: 0.25 mg via INTRAVENOUS
  Filled 2022-06-27: qty 5

## 2022-06-27 NOTE — Patient Instructions (Signed)
[place] [facility] [place] [facility]  Discharge Instructions: Thank you for choosing [facility] to provide your oncology and hematology care.  If you have a lab appointment with the Cancer Center, please go directly to the Cancer Center and check in at the registration area.  Wear comfortable clothing and clothing appropriate for easy access to any Portacath or PICC line.   We strive to give you quality time with your provider. You may need to reschedule your appointment if you arrive late (15 or more minutes).  Arriving late affects you and other patients whose appointments are after yours.  Also, if you miss three or more appointments without notifying the office, you may be dismissed from the clinic at the provider's discretion.      For prescription refill requests, have your pharmacy contact our office and allow 72 hours for refills to be completed.    Today you received the following chemotherapy and/or immunotherapy agents- cisplatin      To help prevent nausea and vomiting after your treatment, we encourage you to take your nausea medication as directed.  BELOW ARE SYMPTOMS THAT SHOULD BE REPORTED IMMEDIATELY: *FEVER GREATER THAN 100.4 F (38 C) OR HIGHER *CHILLS OR SWEATING *NAUSEA AND VOMITING THAT IS NOT CONTROLLED WITH YOUR NAUSEA MEDICATION *UNUSUAL SHORTNESS OF BREATH *UNUSUAL BRUISING OR BLEEDING *URINARY PROBLEMS (pain or burning when urinating, or frequent urination) *BOWEL PROBLEMS (unusual diarrhea, constipation, pain near the anus) TENDERNESS IN MOUTH AND THROAT WITH OR WITHOUT PRESENCE OF ULCERS (sore throat, sores in mouth, or a toothache) UNUSUAL RASH, SWELLING OR PAIN  UNUSUAL VAGINAL DISCHARGE OR ITCHING   Items with * indicate a potential emergency and should be followed up as soon as possible or go to the Emergency Department if any problems should occur.  Please show the CHEMOTHERAPY ALERT CARD or IMMUNOTHERAPY ALERT CARD at check-in to  the Emergency Department and triage nurse.  Should you have questions after your visit or need to cancel or reschedule your appointment, please contact [place] [facility] [place] [facility]  [phone] and follow the prompts.  Office hours are 8:00 a.m. to 4:30 p.m. Monday - Friday. Please note that voicemails left after 4:00 p.m. may not be returned until the following business day.  We are closed weekends and major holidays. You have access to a nurse at all times for urgent questions. Please call the main number to the clinic [phone] and follow the prompts.  For any non-urgent questions, you may also contact your provider using MyChart. We now offer e-Visits for anyone 18 and older to request care online for non-urgent symptoms. For details visit [email or web address].   Also download the MyChart app! Go to the app store, search "MyChart", open the app, select , and log in with your MyChart username and password.    

## 2022-06-27 NOTE — Progress Notes (Signed)
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  INTERVAL HISTORY: Patient returns to clinic today for further evaluation and consideration of cycle 2 of weekly cisplatin.  He continues with daily XRT.  He has increased serosanguineous drainage from the wound over his mass, but otherwise feels well.  He tolerated his first treatment without significant side effects.  He has a fair appetite.  He does not complain of any pain or dysphagia.   He has no neurologic complaints.  He denies any recent fevers or illnesses.  He has no chest pain, shortness of breath, cough, or hemoptysis.  He denies any nausea, vomiting, constipation, or diarrhea.  He has no urinary complaints.  Patient offers no further specific complaints today.  REVIEW OF SYSTEMS:   Review of Systems  Constitutional:  Positive for weight loss. Negative for fever and malaise/fatigue.  HENT:  Negative for sore throat.   Respiratory: Negative.  Negative for cough, hemoptysis and shortness of breath.   Cardiovascular: Negative.  Negative for chest pain and leg swelling.  Gastrointestinal: Negative.  Negative for abdominal pain.  Genitourinary: Negative.  Negative for dysuria.  Musculoskeletal: Negative.  Negative for back pain.  Skin: Negative.  Negative for rash.  Neurological: Negative.  Negative for dizziness, focal weakness, weakness and headaches.  Psychiatric/Behavioral: Negative.  The patient is not nervous/anxious.     As per HPI. Otherwise, a complete review of systems is negative.  PAST MEDICAL HISTORY: History reviewed. No pertinent past medical history.  PAST SURGICAL HISTORY: Past Surgical History:  Procedure Laterality Date   APPENDECTOMY  06/22/20   JOINT REPLACEMENT  02/26/2003   hip   LAPAROSCOPIC  APPENDECTOMY N/A 06/23/2020   Procedure: APPENDECTOMY LAPAROSCOPIC;  Surgeon: Duanne Guess, MD;  Location: [facility] ORS;  Service: General;  Laterality: N/A;   TOTAL HIP ARTHROPLASTY Right     FAMILY HISTORY: Family History  Problem Relation Age of Onset   Alzheimer's disease Mother    Stroke Father     ADVANCED DIRECTIVES (Y/N):  N  HEALTH MAINTENANCE: Social History   Tobacco Use   Smoking status: Every Day    Packs/day: .5    Types: Cigarettes   Smokeless tobacco: Never  Vaping Use   Vaping Use: Never used  Substance Use Topics   Alcohol use: Yes    Alcohol/week: 5.0 standard drinks of alcohol    Types: 5 Shots of liquor per week   Drug use: Not Currently     Colonoscopy:  PAP:  Bone density:  Lipid panel:  No Known Allergies  Current Outpatient Medications  Medication Sig Dispense Refill   ondansetron (ZOFRAN) 8 MG tablet Take 1 tablet (8 mg total) by mouth every 8 (eight) hours as needed for nausea or vomiting. Start on the third day after cisplatin. 60 tablet 1   prochlorperazine (COMPAZINE) 10 MG tablet Take 1 tablet (10 mg total) by mouth every 6 (six) hours as needed (Nausea or vomiting). 60 tablet 1   amoxicillin-clavulanate (AUGMENTIN) 875-125 MG tablet Take 1 tablet by mouth 2 (two) times daily. (Patient not taking: Reported on 06/20/2022) 20 tablet 0   No current facility-administered medications for this visit.   Facility-Administered Medications Ordered in Other Visits  Medication Dose Route Frequency Provider Last Rate Last Admin   CISplatin (PLATINOL) 68 mg in  sodium chloride 0.9 % 250 mL chemo infusion  40 mg/m2 (Treatment Plan Recorded) Intravenous Once Jeralyn Ruths, MD       dexamethasone (DECADRON) 10 mg in sodium chloride 0.9 % 50 mL IVPB  10 mg Intravenous Once Jeralyn Ruths, MD       fosaprepitant (EMEND) 150 mg in sodium chloride 0.9 % 145 mL IVPB  150 mg Intravenous Once Jeralyn Ruths, MD       magnesium sulfate IVPB 2 g 50  mL  2 g Intravenous Once Jeralyn Ruths, MD 50 mL/hr at 06/27/22 0938 2 g at 06/27/22 0938   palonosetron (ALOXI) injection 0.25 mg  0.25 mg Intravenous Once Jeralyn Ruths, MD        OBJECTIVE: Vitals:   06/27/22 0835  BP: 122/74  Pulse: 76  Resp: 20  Temp: (!) 96.4 F (35.8 C)  SpO2: 100%     Body mass index is 19.8 kg/m.    ECOG FS:0 - Asymptomatic  General: Well-developed, well-nourished, no acute distress. Eyes: Pink conjunctiva, anicteric sclera. HEENT: Normocephalic, moist mucous membranes.  Easily palpable right neck mass with central ulceration. Lungs: No audible wheezing or coughing. Heart: Regular rate and rhythm. Abdomen: Soft, nontender, no obvious distention. Musculoskeletal: No edema, cyanosis, or clubbing. Neuro: Alert, answering all questions appropriately. Cranial nerves grossly intact. Skin: No rashes or petechiae noted. Psych: Normal affect.   LAB RESULTS:  Lab Results  Component Value Date   NA 140 06/27/2022   K 3.7 06/27/2022   CL 104 06/27/2022   CO2 25 06/27/2022   GLUCOSE 104 (H) 06/27/2022   BUN 32 (H) 06/27/2022   CREATININE 0.76 06/27/2022   CALCIUM 9.7 06/27/2022   PROT 7.7 05/28/2022   ALBUMIN 4.2 05/28/2022   AST 18 05/28/2022   ALT 15 05/28/2022   ALKPHOS 69 05/28/2022   BILITOT 0.6 05/28/2022   GFRNONAA >60 06/27/2022    Lab Results  Component Value Date   WBC 12.1 (H) 06/27/2022   NEUTROABS 7.9 (H) 06/27/2022   HGB 13.7 06/27/2022   HCT 41.7 06/27/2022   MCV 95.0 06/27/2022   PLT 390 06/27/2022     STUDIES: No results found.  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  CT does not distinctly indicate a primary lesion.  PET scan results from May 27, 2022 reviewed independently which large intensely hypermetabolic right neck mass consistent  with nodal metastasis.  Patient also noted to have a right hypopharynx/piriform sinus focus which is possibly the primary lesion.  He also was noted to have indeterminate right axillary and right inguinal lymph nodes, unlikely metastasis but will monitor closely.   Patient does not require port placement, but would consider one in the future if IV access became an issue.  Continue daily XRT.  Proceed with cycle 2 of treatment today.  Return to clinic in 1 week for further evaluation and consideration of cycle 3. Leukocytosis: Improved likely reactive, monitor. Neck wound: This could possibly get worse as XRT continues.  Patient was given a referral to wound care.  I spent a total of 30 minutes reviewing chart data, face-to-face evaluation with the patient, counseling and coordination of care as detailed above.    Patient expressed understanding and was in agreement with this plan. He also understands that He can call  clinic at any time with any questions, concerns, or complaints.    Cancer Staging  Squamous cell carcinoma of head and neck Staging form: Cervical Lymph Nodes and Unknown Primary Tumors of the Head and Neck, AJCC 8th Edition - Clinical: Stage IVA (cT0, cN2c, cM0) - Signed by Jeralyn Ruths, MD on 06/20/2022   Jeralyn Ruths, MD   06/27/2022 9:53 AM

## 2022-06-28 ENCOUNTER — Ambulatory Visit
Admission: RE | Admit: 2022-06-28 | Discharge: 2022-06-28 | Disposition: A | Discharge status: Home / Self Care | Payer: Medicare Other | Source: Ambulatory Visit | Attending: Radiation Oncology | Admitting: Radiation Oncology

## 2022-06-28 ENCOUNTER — Other Ambulatory Visit: Payer: Self-pay

## 2022-06-28 DIAGNOSIS — C760 Malignant neoplasm of head, face and neck: Secondary | ICD-10-CM | POA: Diagnosis not present

## 2022-06-28 LAB — RAD ONC ARIA SESSION SUMMARY
Course Elapsed Days: 4
Plan Fractions Treated to Date: 5
Plan Prescribed Dose Per Fraction: 2 Gy
Plan Total Fractions Prescribed: 35
Plan Total Prescribed Dose: 70 Gy
Reference Point Dosage Given to Date: 10 Gy
Reference Point Session Dosage Given: 2 Gy
Session Number: 5

## 2022-07-01 ENCOUNTER — Other Ambulatory Visit: Payer: Self-pay

## 2022-07-01 ENCOUNTER — Ambulatory Visit
Admission: RE | Admit: 2022-07-01 | Discharge: 2022-07-01 | Disposition: A | Discharge status: Home / Self Care | Payer: Medicare Other | Source: Ambulatory Visit | Attending: Radiation Oncology | Admitting: Radiation Oncology

## 2022-07-01 DIAGNOSIS — C760 Malignant neoplasm of head, face and neck: Secondary | ICD-10-CM | POA: Diagnosis not present

## 2022-07-01 LAB — RAD ONC ARIA SESSION SUMMARY
Course Elapsed Days: 7
Plan Fractions Treated to Date: 6
Plan Prescribed Dose Per Fraction: 2 Gy
Plan Total Fractions Prescribed: 35
Plan Total Prescribed Dose: 70 Gy
Reference Point Dosage Given to Date: 12 Gy
Reference Point Session Dosage Given: 2 Gy
Session Number: 6

## 2022-07-02 ENCOUNTER — Other Ambulatory Visit: Payer: Self-pay

## 2022-07-02 ENCOUNTER — Ambulatory Visit
Admission: RE | Admit: 2022-07-02 | Discharge: 2022-07-02 | Disposition: A | Discharge status: Home / Self Care | Payer: Medicare Other | Source: Ambulatory Visit | Attending: Radiation Oncology | Admitting: Radiation Oncology

## 2022-07-02 DIAGNOSIS — C760 Malignant neoplasm of head, face and neck: Secondary | ICD-10-CM | POA: Diagnosis not present

## 2022-07-02 LAB — RAD ONC ARIA SESSION SUMMARY
Course Elapsed Days: 8
Plan Fractions Treated to Date: 7
Plan Prescribed Dose Per Fraction: 2 Gy
Plan Total Fractions Prescribed: 35
Plan Total Prescribed Dose: 70 Gy
Reference Point Dosage Given to Date: 14 Gy
Reference Point Session Dosage Given: 2 Gy
Session Number: 7

## 2022-07-03 ENCOUNTER — Other Ambulatory Visit: Payer: Self-pay

## 2022-07-03 ENCOUNTER — Ambulatory Visit
Admission: RE | Admit: 2022-07-03 | Discharge: 2022-07-03 | Disposition: A | Discharge status: Home / Self Care | Payer: Medicare Other | Source: Ambulatory Visit | Attending: Radiation Oncology | Admitting: Radiation Oncology

## 2022-07-03 DIAGNOSIS — C760 Malignant neoplasm of head, face and neck: Secondary | ICD-10-CM | POA: Diagnosis not present

## 2022-07-03 LAB — RAD ONC ARIA SESSION SUMMARY
Course Elapsed Days: 9
Plan Fractions Treated to Date: 8
Plan Prescribed Dose Per Fraction: 2 Gy
Plan Total Fractions Prescribed: 35
Plan Total Prescribed Dose: 70 Gy
Reference Point Dosage Given to Date: 16 Gy
Reference Point Session Dosage Given: 2 Gy
Session Number: 8

## 2022-07-03 MED FILL — Dexamethasone Sodium Phosphate Inj 100 MG/10ML: INTRAMUSCULAR | Qty: 1 | Status: AC

## 2022-07-03 MED FILL — Fosaprepitant Dimeglumine For IV Infusion 150 MG (Base Eq): INTRAVENOUS | Qty: 5 | Status: AC

## 2022-07-04 ENCOUNTER — Other Ambulatory Visit: Payer: Self-pay

## 2022-07-04 ENCOUNTER — Inpatient Hospital Stay: Payer: Medicare Other

## 2022-07-04 ENCOUNTER — Inpatient Hospital Stay: Payer: Medicare Other | Admitting: Oncology

## 2022-07-04 ENCOUNTER — Encounter: Payer: Self-pay | Admitting: Oncology

## 2022-07-04 ENCOUNTER — Ambulatory Visit
Admission: RE | Admit: 2022-07-04 | Discharge: 2022-07-04 | Disposition: A | Discharge status: Home / Self Care | Payer: Medicare Other | Source: Ambulatory Visit | Attending: Radiation Oncology | Admitting: Radiation Oncology

## 2022-07-04 VITALS — BP 134/67 | HR 73 | Temp 96.1°F | Resp 18 | Ht 69.0 in | Wt 131.6 lb

## 2022-07-04 VITALS — BP 143/76 | HR 76 | Resp 16

## 2022-07-04 DIAGNOSIS — C760 Malignant neoplasm of head, face and neck: Secondary | ICD-10-CM | POA: Diagnosis not present

## 2022-07-04 DIAGNOSIS — C4442 Squamous cell carcinoma of skin of scalp and neck: Secondary | ICD-10-CM

## 2022-07-04 LAB — BASIC METABOLIC PANEL - CANCER CENTER ONLY
Anion gap: 10 (ref 5–15)
BUN: 23 mg/dL (ref 8–23)
CO2: 26 mmol/L (ref 22–32)
Calcium: 9.3 mg/dL (ref 8.9–10.3)
Chloride: 102 mmol/L (ref 98–111)
Creatinine: 0.68 mg/dL (ref 0.61–1.24)
GFR, Estimated: >60 mL/min (ref >60)
Glucose, Bld: 104 mg/dL — ABNORMAL HIGH (ref 70–99)
Potassium: 4 mmol/L (ref 3.5–5.1)
Sodium: 138 mmol/L (ref 135–145)

## 2022-07-04 LAB — RAD ONC ARIA SESSION SUMMARY
Course Elapsed Days: 10
Plan Fractions Treated to Date: 9
Plan Prescribed Dose Per Fraction: 2 Gy
Plan Total Fractions Prescribed: 35
Plan Total Prescribed Dose: 70 Gy
Reference Point Dosage Given to Date: 18 Gy
Reference Point Session Dosage Given: 2 Gy
Session Number: 9

## 2022-07-04 LAB — CBC WITH DIFFERENTIAL (CANCER CENTER ONLY)
Abs Immature Granulocytes: 0.05 10*3/uL (ref 0.00–0.07)
Basophils Absolute: 0.1 10*3/uL (ref 0.0–0.1)
Basophils Relative: 1 %
Eosinophils Absolute: 0.2 10*3/uL (ref 0.0–0.5)
Eosinophils Relative: 2 %
HCT: 36.7 % — ABNORMAL LOW (ref 39.0–52.0)
Hemoglobin: 12.4 g/dL — ABNORMAL LOW (ref 13.0–17.0)
Immature Granulocytes: 1 %
Lymphocytes Relative: 18 %
Lymphs Abs: 1.4 10*3/uL (ref 0.7–4.0)
MCH: 31.8 pg (ref 26.0–34.0)
MCHC: 33.8 g/dL (ref 30.0–36.0)
MCV: 94.1 fL (ref 80.0–100.0)
Monocytes Absolute: 0.8 10*3/uL (ref 0.1–1.0)
Monocytes Relative: 10 %
Neutro Abs: 5 10*3/uL (ref 1.7–7.7)
Neutrophils Relative %: 68 %
Platelet Count: 317 10*3/uL (ref 150–400)
RBC: 3.9 MIL/uL — ABNORMAL LOW (ref 4.22–5.81)
RDW: 13.5 % (ref 11.5–15.5)
WBC Count: 7.5 10*3/uL (ref 4.0–10.5)
nRBC: 0 % (ref 0.0–0.2)

## 2022-07-04 LAB — MAGNESIUM: Magnesium: 2.1 mg/dL (ref 1.7–2.4)

## 2022-07-04 MED ORDER — POTASSIUM CHLORIDE IN NACL 20-0.9 MEQ/L-% IV SOLN
Freq: Once | INTRAVENOUS | Status: AC
  Filled 2022-07-04: qty 1000

## 2022-07-04 MED ORDER — SODIUM CHLORIDE 0.9 % IV SOLN
40.0000 mg/m2 | Freq: Once | INTRAVENOUS | Status: AC
  Administered 2022-07-04: 68 mg via INTRAVENOUS
  Filled 2022-07-04: qty 68

## 2022-07-04 MED ORDER — SODIUM CHLORIDE 0.9 % IV SOLN
10.0000 mg | Freq: Once | INTRAVENOUS | Status: AC
  Administered 2022-07-04: 10 mg via INTRAVENOUS
  Filled 2022-07-04: qty 10

## 2022-07-04 MED ORDER — MAGNESIUM SULFATE 2 GM/50ML IV SOLN
2.0000 g | Freq: Once | INTRAVENOUS | Status: AC
  Administered 2022-07-04: 2 g via INTRAVENOUS
  Filled 2022-07-04: qty 50

## 2022-07-04 MED ORDER — SODIUM CHLORIDE 0.9 % IV SOLN
150.0000 mg | Freq: Once | INTRAVENOUS | Status: AC
  Administered 2022-07-04: 150 mg via INTRAVENOUS
  Filled 2022-07-04: qty 150

## 2022-07-04 MED ORDER — PALONOSETRON HCL INJECTION 0.25 MG/5ML
0.2500 mg | Freq: Once | INTRAVENOUS | Status: AC
  Administered 2022-07-04: 0.25 mg via INTRAVENOUS
  Filled 2022-07-04: qty 5

## 2022-07-04 MED ORDER — SODIUM CHLORIDE 0.9 % IV SOLN
Freq: Once | INTRAVENOUS | Status: AC
  Filled 2022-07-04: qty 250

## 2022-07-04 NOTE — Progress Notes (Signed)
[facility] Clinical Social Work  Initial Assessment   Gary Wilkins is a 72 y.o. year old male presenting alone. Clinical Social Work was referred by medical provider for assessment of psychosocial needs.   SDOH (Social Determinants of Health) assessments performed: Yes SDOH Interventions    Flowsheet Row Office Visit from 05/28/2022 in [facility]  SDOH Interventions   Food Insecurity Interventions Intervention Not Indicated  Housing Interventions Intervention Not Indicated  Transportation Interventions Intervention Not Indicated  Utilities Interventions Intervention Not Indicated       SDOH Screenings   Food Insecurity: No Food Insecurity (05/28/2022)  Housing: Low Risk  (05/28/2022)  Transportation Needs: No Transportation Needs (05/28/2022)  Utilities: Not At Risk (05/28/2022)  Depression (PHQ2-9): Low Risk  (05/28/2022)  Tobacco Use: High Risk (07/04/2022)     Distress Screen completed: No     No data to display            Family/Social Information:  Housing Arrangement: patient lives alone.  He moved from [place] in 2010. Family members/support persons in your life? Family and Friends.  Patient was born and raised in [place], [place].  He moved to [place] to be near his fiance.  They were married and she died four months later.  He adopted her  daughter who lives locally. 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  Income source: Employment and Actor concerns: No Type of concern: None Food access concerns: no Religious or spiritual practice: Not known Services Currently in place:  Medicare  Coping/ Adjustment to diagnosis: Patient understands treatment plan and what happens next? yes Concerns about diagnosis and/or treatment: I'm not especially worried about anything Patient reported stressors: Adjusting to my illness Hopes and/or priorities:  Family Patient enjoys time with family/ friends Current coping skills/ strengths: Active sense of humor , Average or above average intelligence , Capable of independent living , Communication skills , Contractor , General fund of knowledge , and Supportive family/friends     SUMMARY: Current SDOH Barriers:  Limited social support  Clinical Social Work Clinical Goal(s):  Explore community resource options for unmet needs related to:  Social Connections  Interventions: Discussed common feeling and emotions when being diagnosed with cancer, and the importance of support during treatment Informed patient of the support team roles and support services at [facility] Provided CSW contact information and encouraged patient to call with any questions or concerns Provided patient with information about the [employer] and CSW contact information.  Patient appeared to respond positively to life review.   Follow Up Plan: Patient will contact CSW with any support or resource needs Patient verbalizes understanding of plan: Yes    Dorothey Baseman, LCSW Clinical Social Worker [facility]

## 2022-07-04 NOTE — Progress Notes (Signed)
Nutrition Follow-up:  Patient with stage IV squamous cell carcinoma of head and neck (right neck mass), primary lesion not indicated on CT.  Patient receiving concurrent chemotherapy and radiation.   Met with patient in infusion.  Reports that he is forcing self to eat.  Some foods taste bad.  Says that he is able to eat pudding, peanuts, spaghetti, cheese and crackers, ice cream, gatorade.  Having issues with constipation (on stool softner).  Drinking ensure plus mostly 2 a day.  Trying to increase calories.     Medications: reviewed  Labs: reviewed  Anthropometrics:   Weight 131 lb 9.6 oz 131 lb 8 oz on 5/20 140 lb per patient about 1 month ago   NUTRITION DIAGNOSIS: Unintentional weight loss stable    INTERVENTION:  Continue to encourage high calorie, high protein foods Continue 350 calorie shake for added nutrition     MONITORING, EVALUATION, GOAL: weight trends, intake   NEXT VISIT: Thursday, June 27 during infusion  Hansford Hirt B. Freida Busman, RD, LDN Registered Dietitian [phone]

## 2022-07-04 NOTE — Progress Notes (Signed)
After 2 hrs prehydration, pt has voided 100cc (200cc required prior to starting treatment per protocol).  Per Dr Orlie Dakin, okay to proceed with treatment.  Will continue to monitor output.    Okay to run hydration with cisplatin per Dr Orlie Dakin.

## 2022-07-04 NOTE — Patient Instructions (Signed)
[facility] [place] [facility]  Discharge Instructions: Thank you for choosing [facility] to provide your oncology and hematology care.  If you have a lab appointment with the Cancer Center, please go directly to the Cancer Center and check in at the registration area.  Wear comfortable clothing and clothing appropriate for easy access to any Portacath or PICC line.   We strive to give you quality time with your provider. You may need to reschedule your appointment if you arrive late (15 or more minutes).  Arriving late affects you and other patients whose appointments are after yours.  Also, if you miss three or more appointments without notifying the office, you may be dismissed from the clinic at the provider's discretion.      For prescription refill requests, have your pharmacy contact our office and allow 72 hours for refills to be completed.    Today you received the following chemotherapy and/or immunotherapy agents- cisplatin      To help prevent nausea and vomiting after your treatment, we encourage you to take your nausea medication as directed.  BELOW ARE SYMPTOMS THAT SHOULD BE REPORTED IMMEDIATELY: *FEVER GREATER THAN 100.4 F (38 C) OR HIGHER *CHILLS OR SWEATING *NAUSEA AND VOMITING THAT IS NOT CONTROLLED WITH YOUR NAUSEA MEDICATION *UNUSUAL SHORTNESS OF BREATH *UNUSUAL BRUISING OR BLEEDING *URINARY PROBLEMS (pain or burning when urinating, or frequent urination) *BOWEL PROBLEMS (unusual diarrhea, constipation, pain near the anus) TENDERNESS IN MOUTH AND THROAT WITH OR WITHOUT PRESENCE OF ULCERS (sore throat, sores in mouth, or a toothache) UNUSUAL RASH, SWELLING OR PAIN  UNUSUAL VAGINAL DISCHARGE OR ITCHING   Items with * indicate a potential emergency and should be followed up as soon as possible or go to the Emergency Department if any problems should occur.  Please show the CHEMOTHERAPY ALERT CARD or IMMUNOTHERAPY ALERT CARD at check-in to  the Emergency Department and triage nurse.  Should you have questions after your visit or need to cancel or reschedule your appointment, please contact [facility] [place] [facility]  [phone] and follow the prompts.  Office hours are 8:00 a.m. to 4:30 p.m. Monday - Friday. Please note that voicemails left after 4:00 p.m. may not be returned until the following business day.  We are closed weekends and major holidays. You have access to a nurse at all times for urgent questions. Please call the main number to the clinic [phone] and follow the prompts.  For any non-urgent questions, you may also contact your provider using MyChart. We now offer e-Visits for anyone 18 and older to request care online for non-urgent symptoms. For details visit [email or web address].   Also download the MyChart app! Go to the app store, search "MyChart", open the app, select [place], and log in with your MyChart username and password.    

## 2022-07-04 NOTE — Progress Notes (Signed)
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  INTERVAL HISTORY: Patient returns to clinic today for further evaluation and consideration of cycle 3 of weekly cisplatin.  He continues with his daily XRT.  The mass on his right neck has decreased in size and there is less drainage.  He declined appointment with wound care.  He has decreased appetite secondary to the taste of food. He does not complain of any pain or dysphagia. He has no neurologic complaints.  He denies any recent fevers or illnesses.  He has no chest pain, shortness of breath, cough, or hemoptysis.  He denies any nausea, vomiting, constipation, or diarrhea.  He has no urinary complaints.  Patient offers no further specific complaints today.  REVIEW OF SYSTEMS:   Review of Systems  Constitutional: Negative.  Negative for fever, malaise/fatigue and weight loss.  HENT:  Negative for sore throat.   Respiratory: Negative.  Negative for cough, hemoptysis and shortness of breath.   Cardiovascular: Negative.  Negative for chest pain and leg swelling.  Gastrointestinal: Negative.  Negative for abdominal pain.  Genitourinary: Negative.  Negative for dysuria.  Musculoskeletal: Negative.  Negative for back pain.  Skin: Negative.  Negative for rash.  Neurological: Negative.  Negative for dizziness, focal weakness, weakness and headaches.  Psychiatric/Behavioral: Negative.  The patient is not nervous/anxious.     As per HPI. Otherwise, a complete review of systems is negative.  PAST MEDICAL HISTORY: History reviewed. No pertinent past medical history.  PAST SURGICAL HISTORY: Past Surgical History:  Procedure Laterality Date   APPENDECTOMY  06/22/20   JOINT REPLACEMENT  02/26/2003   hip   LAPAROSCOPIC APPENDECTOMY N/A  06/23/2020   Procedure: APPENDECTOMY LAPAROSCOPIC;  Surgeon: Duanne Guess, MD;  Location: [facility] ORS;  Service: General;  Laterality: N/A;   TOTAL HIP ARTHROPLASTY Right     FAMILY HISTORY: Family History  Problem Relation Age of Onset   Alzheimer's disease Mother    Stroke Father     ADVANCED DIRECTIVES (Y/N):  N  HEALTH MAINTENANCE: Social History   Tobacco Use   Smoking status: Every Day    Packs/day: .5    Types: Cigarettes   Smokeless tobacco: Never  Vaping Use   Vaping Use: Never used  Substance Use Topics   Alcohol use: Yes    Alcohol/week: 5.0 standard drinks of alcohol    Types: 5 Shots of liquor per week   Drug use: Not Currently     Colonoscopy:  PAP:  Bone density:  Lipid panel:  No Known Allergies  Current Outpatient Medications  Medication Sig Dispense Refill   ondansetron (ZOFRAN) 8 MG tablet Take 1 tablet (8 mg total) by mouth every 8 (eight) hours as needed for nausea or vomiting. Start on the third day after cisplatin. 60 tablet 1   prochlorperazine (COMPAZINE) 10 MG tablet Take 1 tablet (10 mg total) by mouth every 6 (six) hours as needed (Nausea or vomiting). 60 tablet 1   amoxicillin-clavulanate (AUGMENTIN) 875-125 MG tablet Take 1 tablet by mouth 2 (two) times daily. (Patient not taking: Reported on 06/20/2022) 20 tablet 0   No current facility-administered medications for this visit.   Facility-Administered Medications Ordered in Other Visits  Medication Dose Route Frequency Provider Last Rate Last Admin   CISplatin (PLATINOL) 68 mg in sodium  chloride 0.9 % 250 mL chemo infusion  40 mg/m2 (Treatment Plan Recorded) Intravenous Once Jeralyn Ruths, MD       dexamethasone (DECADRON) 10 mg in sodium chloride 0.9 % 50 mL IVPB  10 mg Intravenous Once Jeralyn Ruths, MD       fosaprepitant (EMEND) 150 mg in sodium chloride 0.9 % 145 mL IVPB  150 mg Intravenous Once Jeralyn Ruths, MD       magnesium sulfate IVPB 2 g 50 mL  2 g  Intravenous Once Jeralyn Ruths, MD 50 mL/hr at 07/04/22 0930 2 g at 07/04/22 0930   palonosetron (ALOXI) injection 0.25 mg  0.25 mg Intravenous Once Jeralyn Ruths, MD        OBJECTIVE: Vitals:   07/04/22 0832  BP: 134/67  Pulse: 73  Resp: 18  Temp: (!) 96.1 F (35.6 C)  SpO2: 98%     Body mass index is 19.43 kg/m.    ECOG FS:0 - Asymptomatic  General: Well-developed, well-nourished, no acute distress. Eyes: Pink conjunctiva, anicteric sclera. HEENT: Normocephalic, moist mucous membranes.  Mass on right neck significantly decreased in size. Lungs: No audible wheezing or coughing. Heart: Regular rate and rhythm. Abdomen: Soft, nontender, no obvious distention. Musculoskeletal: No edema, cyanosis, or clubbing. Neuro: Alert, answering all questions appropriately. Cranial nerves grossly intact. Skin: No rashes or petechiae noted. Psych: Normal affect.   LAB RESULTS:  Lab Results  Component Value Date   NA 138 07/04/2022   K 4.0 07/04/2022   CL 102 07/04/2022   CO2 26 07/04/2022   GLUCOSE 104 (H) 07/04/2022   BUN 23 07/04/2022   CREATININE 0.68 07/04/2022   CALCIUM 9.3 07/04/2022   PROT 7.7 05/28/2022   ALBUMIN 4.2 05/28/2022   AST 18 05/28/2022   ALT 15 05/28/2022   ALKPHOS 69 05/28/2022   BILITOT 0.6 05/28/2022   GFRNONAA >60 07/04/2022    Lab Results  Component Value Date   WBC 7.5 07/04/2022   NEUTROABS 5.0 07/04/2022   HGB 12.4 (L) 07/04/2022   HCT 36.7 (L) 07/04/2022   MCV 94.1 07/04/2022   PLT 317 07/04/2022     STUDIES: No results found.  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  CT does not distinctly indicate a primary lesion.  PET scan results from May 27, 2022 reviewed independently which large intensely hypermetabolic right neck mass consistent with nodal  metastasis.  Patient also noted to have a right hypopharynx/piriform sinus focus which is possibly the primary lesion.  He also was noted to have indeterminate right axillary and right inguinal lymph nodes, unlikely metastasis but will monitor closely.   Patient does not require port placement, but would consider one in the future if IV access became an issue.  Continue with daily XRT.  Proceed with cycle 3 of treatment today.  Return to clinic in 1 week for further evaluation and consideration of cycle 4.   Leukocytosis: Resolved. Anemia: Mild, monitor. Neck wound: Slightly improved.  Patient declined wound care clinic appointment.  Monitor.   Patient expressed understanding and was in agreement with this plan. He also understands that He can call clinic at any time with any questions, concerns, or complaints.    Cancer Staging  Squamous cell carcinoma of head and neck Staging form: Cervical Lymph Nodes and Unknown  Primary Tumors of the Head and Neck, AJCC 8th Edition - Clinical: Stage IVA (cT0, cN2c, cM0) - Signed by Jeralyn Ruths, MD on 06/20/2022   Jeralyn Ruths, MD   07/04/2022 9:38 AM

## 2022-07-05 ENCOUNTER — Ambulatory Visit
Admission: RE | Admit: 2022-07-05 | Discharge: 2022-07-05 | Disposition: A | Discharge status: Home / Self Care | Payer: Medicare Other | Source: Ambulatory Visit | Attending: Radiation Oncology | Admitting: Radiation Oncology

## 2022-07-05 ENCOUNTER — Other Ambulatory Visit: Payer: Self-pay

## 2022-07-05 DIAGNOSIS — C760 Malignant neoplasm of head, face and neck: Secondary | ICD-10-CM | POA: Diagnosis not present

## 2022-07-05 LAB — RAD ONC ARIA SESSION SUMMARY
Course Elapsed Days: 11
Plan Fractions Treated to Date: 10
Plan Prescribed Dose Per Fraction: 2 Gy
Plan Total Fractions Prescribed: 35
Plan Total Prescribed Dose: 70 Gy
Reference Point Dosage Given to Date: 20 Gy
Reference Point Session Dosage Given: 2 Gy
Session Number: 10

## 2022-07-08 ENCOUNTER — Other Ambulatory Visit: Payer: Self-pay

## 2022-07-08 ENCOUNTER — Ambulatory Visit
Admission: RE | Admit: 2022-07-08 | Discharge: 2022-07-08 | Disposition: A | Discharge status: Home / Self Care | Payer: Medicare Other | Source: Ambulatory Visit | Attending: Radiation Oncology | Admitting: Radiation Oncology

## 2022-07-08 DIAGNOSIS — C760 Malignant neoplasm of head, face and neck: Secondary | ICD-10-CM | POA: Diagnosis not present

## 2022-07-08 LAB — RAD ONC ARIA SESSION SUMMARY
Course Elapsed Days: 14
Plan Fractions Treated to Date: 11
Plan Prescribed Dose Per Fraction: 2 Gy
Plan Total Fractions Prescribed: 35
Plan Total Prescribed Dose: 70 Gy
Reference Point Dosage Given to Date: 22 Gy
Reference Point Session Dosage Given: 2 Gy
Session Number: 11

## 2022-07-09 ENCOUNTER — Ambulatory Visit
Admission: RE | Admit: 2022-07-09 | Discharge: 2022-07-09 | Disposition: A | Discharge status: Home / Self Care | Payer: Medicare Other | Source: Ambulatory Visit | Attending: Radiation Oncology | Admitting: Radiation Oncology

## 2022-07-09 ENCOUNTER — Other Ambulatory Visit: Payer: Self-pay

## 2022-07-09 DIAGNOSIS — C760 Malignant neoplasm of head, face and neck: Secondary | ICD-10-CM | POA: Diagnosis not present

## 2022-07-09 LAB — RAD ONC ARIA SESSION SUMMARY
Course Elapsed Days: 15
Plan Fractions Treated to Date: 12
Plan Prescribed Dose Per Fraction: 2 Gy
Plan Total Fractions Prescribed: 35
Plan Total Prescribed Dose: 70 Gy
Reference Point Dosage Given to Date: 24 Gy
Reference Point Session Dosage Given: 2 Gy
Session Number: 12

## 2022-07-10 ENCOUNTER — Ambulatory Visit
Admission: RE | Admit: 2022-07-10 | Discharge: 2022-07-10 | Disposition: A | Discharge status: Home / Self Care | Payer: Medicare Other | Source: Ambulatory Visit | Attending: Radiation Oncology | Admitting: Radiation Oncology

## 2022-07-10 ENCOUNTER — Other Ambulatory Visit: Payer: Self-pay

## 2022-07-10 DIAGNOSIS — C760 Malignant neoplasm of head, face and neck: Secondary | ICD-10-CM | POA: Diagnosis not present

## 2022-07-10 LAB — RAD ONC ARIA SESSION SUMMARY
Course Elapsed Days: 16
Plan Fractions Treated to Date: 1
Plan Prescribed Dose Per Fraction: 2 Gy
Plan Total Fractions Prescribed: 23
Plan Total Prescribed Dose: 46 Gy
Reference Point Dosage Given to Date: 26 Gy
Reference Point Session Dosage Given: 2 Gy
Session Number: 13

## 2022-07-10 MED FILL — Fosaprepitant Dimeglumine For IV Infusion 150 MG (Base Eq): INTRAVENOUS | Qty: 5 | Status: AC

## 2022-07-10 MED FILL — Dexamethasone Sodium Phosphate Inj 100 MG/10ML: INTRAMUSCULAR | Qty: 1 | Status: AC

## 2022-07-11 ENCOUNTER — Ambulatory Visit
Admission: RE | Admit: 2022-07-11 | Discharge: 2022-07-11 | Disposition: A | Discharge status: Home / Self Care | Payer: Medicare Other | Source: Ambulatory Visit | Attending: Radiation Oncology | Admitting: Radiation Oncology

## 2022-07-11 ENCOUNTER — Other Ambulatory Visit: Payer: Self-pay

## 2022-07-11 ENCOUNTER — Inpatient Hospital Stay: Payer: Medicare Other

## 2022-07-11 ENCOUNTER — Encounter: Payer: Self-pay | Admitting: Nurse Practitioner

## 2022-07-11 ENCOUNTER — Inpatient Hospital Stay (HOSPITAL_BASED_OUTPATIENT_CLINIC_OR_DEPARTMENT_OTHER): Payer: Medicare Other | Admitting: Nurse Practitioner

## 2022-07-11 VITALS — BP 126/65 | HR 65 | Temp 96.8°F | Resp 18 | Ht 69.0 in | Wt 125.7 lb

## 2022-07-11 VITALS — BP 139/68 | HR 75 | Resp 16

## 2022-07-11 DIAGNOSIS — Z5111 Encounter for antineoplastic chemotherapy: Secondary | ICD-10-CM | POA: Diagnosis not present

## 2022-07-11 DIAGNOSIS — C4442 Squamous cell carcinoma of skin of scalp and neck: Secondary | ICD-10-CM | POA: Diagnosis not present

## 2022-07-11 DIAGNOSIS — C760 Malignant neoplasm of head, face and neck: Secondary | ICD-10-CM | POA: Diagnosis not present

## 2022-07-11 LAB — BASIC METABOLIC PANEL - CANCER CENTER ONLY
Anion gap: 6 (ref 5–15)
BUN: 37 mg/dL — ABNORMAL HIGH (ref 8–23)
CO2: 29 mmol/L (ref 22–32)
Calcium: 9.6 mg/dL (ref 8.9–10.3)
Chloride: 104 mmol/L (ref 98–111)
Creatinine: 0.86 mg/dL (ref 0.61–1.24)
GFR, Estimated: >60 mL/min (ref >60)
Glucose, Bld: 180 mg/dL — ABNORMAL HIGH (ref 70–99)
Potassium: 4.7 mmol/L (ref 3.5–5.1)
Sodium: 139 mmol/L (ref 135–145)

## 2022-07-11 LAB — CBC WITH DIFFERENTIAL (CANCER CENTER ONLY)
Abs Immature Granulocytes: 0.04 10*3/uL (ref 0.00–0.07)
Basophils Absolute: 0.1 10*3/uL (ref 0.0–0.1)
Basophils Relative: 1 %
Eosinophils Absolute: 0.1 10*3/uL (ref 0.0–0.5)
Eosinophils Relative: 2 %
HCT: 33.9 % — ABNORMAL LOW (ref 39.0–52.0)
Hemoglobin: 11.3 g/dL — ABNORMAL LOW (ref 13.0–17.0)
Immature Granulocytes: 1 %
Lymphocytes Relative: 18 %
Lymphs Abs: 1.3 10*3/uL (ref 0.7–4.0)
MCH: 31.6 pg (ref 26.0–34.0)
MCHC: 33.3 g/dL (ref 30.0–36.0)
MCV: 94.7 fL (ref 80.0–100.0)
Monocytes Absolute: 0.9 10*3/uL (ref 0.1–1.0)
Monocytes Relative: 12 %
Neutro Abs: 4.8 10*3/uL (ref 1.7–7.7)
Neutrophils Relative %: 66 %
Platelet Count: 240 10*3/uL (ref 150–400)
RBC: 3.58 MIL/uL — ABNORMAL LOW (ref 4.22–5.81)
RDW: 13.8 % (ref 11.5–15.5)
WBC Count: 7.2 10*3/uL (ref 4.0–10.5)
nRBC: 0 % (ref 0.0–0.2)

## 2022-07-11 LAB — RAD ONC ARIA SESSION SUMMARY
Course Elapsed Days: 17
Plan Fractions Treated to Date: 2
Plan Prescribed Dose Per Fraction: 2 Gy
Plan Total Fractions Prescribed: 23
Plan Total Prescribed Dose: 46 Gy
Reference Point Dosage Given to Date: 28 Gy
Reference Point Session Dosage Given: 2 Gy
Session Number: 14

## 2022-07-11 LAB — MAGNESIUM: Magnesium: 2.2 mg/dL (ref 1.7–2.4)

## 2022-07-11 MED ORDER — SODIUM CHLORIDE 0.9 % IV SOLN
10.0000 mg | Freq: Once | INTRAVENOUS | Status: AC
  Administered 2022-07-11: 10 mg via INTRAVENOUS
  Filled 2022-07-11: qty 10

## 2022-07-11 MED ORDER — MAGNESIUM SULFATE 2 GM/50ML IV SOLN
2.0000 g | Freq: Once | INTRAVENOUS | Status: AC
  Administered 2022-07-11: 2 g via INTRAVENOUS
  Filled 2022-07-11: qty 50

## 2022-07-11 MED ORDER — POTASSIUM CHLORIDE IN NACL 20-0.9 MEQ/L-% IV SOLN
Freq: Once | INTRAVENOUS | Status: AC
  Filled 2022-07-11: qty 1000

## 2022-07-11 MED ORDER — SODIUM CHLORIDE 0.9 % IV SOLN
Freq: Once | INTRAVENOUS | Status: AC
  Filled 2022-07-11: qty 250

## 2022-07-11 MED ORDER — SODIUM CHLORIDE 0.9 % IV SOLN
40.0000 mg/m2 | Freq: Once | INTRAVENOUS | Status: AC
  Administered 2022-07-11: 68 mg via INTRAVENOUS
  Filled 2022-07-11: qty 68

## 2022-07-11 MED ORDER — TAMSULOSIN HCL 0.4 MG PO CAPS: 0.4000 mg | ORAL_CAPSULE | Freq: Every day | ORAL | 1 refills | Status: DC

## 2022-07-11 MED ORDER — PALONOSETRON HCL INJECTION 0.25 MG/5ML
0.2500 mg | Freq: Once | INTRAVENOUS | Status: AC
  Administered 2022-07-11: 0.25 mg via INTRAVENOUS
  Filled 2022-07-11: qty 5

## 2022-07-11 MED ORDER — SODIUM CHLORIDE 0.9 % IV SOLN
150.0000 mg | Freq: Once | INTRAVENOUS | Status: AC
  Administered 2022-07-11: 150 mg via INTRAVENOUS
  Filled 2022-07-11: qty 150

## 2022-07-11 NOTE — Progress Notes (Signed)
Patient states that his voice is changing, he has noticed a lot of saliva production.

## 2022-07-11 NOTE — Patient Instructions (Signed)
[facility]  Discharge Instructions: Thank you for choosing [facility] to provide your oncology and hematology care.  If you have a lab appointment with the Cancer Center, please go directly to the Cancer Center and check in at the registration area.  Wear comfortable clothing and clothing appropriate for easy access to any Portacath or PICC line.   We strive to give you quality time with your provider. You may need to reschedule your appointment if you arrive late (15 or more minutes).  Arriving late affects you and other patients whose appointments are after yours.  Also, if you miss three or more appointments without notifying the office, you may be dismissed from the clinic at the provider's discretion.      For prescription refill requests, have your pharmacy contact our office and allow 72 hours for refills to be completed.    Today you received the following chemotherapy and/or immunotherapy agents- cisplatin      To help prevent nausea and vomiting after your treatment, we encourage you to take your nausea medication as directed.  BELOW ARE SYMPTOMS THAT SHOULD BE REPORTED IMMEDIATELY: *FEVER GREATER THAN 100.4 F (38 C) OR HIGHER *CHILLS OR SWEATING *NAUSEA AND VOMITING THAT IS NOT CONTROLLED WITH YOUR NAUSEA MEDICATION *UNUSUAL SHORTNESS OF BREATH *UNUSUAL BRUISING OR BLEEDING *URINARY PROBLEMS (pain or burning when urinating, or frequent urination) *BOWEL PROBLEMS (unusual diarrhea, constipation, pain near the anus) TENDERNESS IN MOUTH AND THROAT WITH OR WITHOUT PRESENCE OF ULCERS (sore throat, sores in mouth, or a toothache) UNUSUAL RASH, SWELLING OR PAIN  UNUSUAL VAGINAL DISCHARGE OR ITCHING   Items with * indicate a potential emergency and should be followed up as soon as possible or go to the Emergency Department if any problems should occur.  Please show the CHEMOTHERAPY ALERT CARD or IMMUNOTHERAPY ALERT CARD at check-in to  the Emergency Department and triage nurse.  Should you have questions after your visit or need to cancel or reschedule your appointment, please contact [facility]  [phone] and follow the prompts.  Office hours are 8:00 a.m. to 4:30 p.m. Monday - Friday. Please note that voicemails left after 4:00 p.m. may not be returned until the following business day.  We are closed weekends and major holidays. You have access to a nurse at all times for urgent questions. Please call the main number to the clinic [phone] and follow the prompts.  For any non-urgent questions, you may also contact your provider using MyChart. We now offer e-Visits for anyone 18 and older to request care online for non-urgent symptoms. For details visit [email or web address].   Also download the MyChart app! Go to the app store, search "MyChart", open the app, select , and log in with your MyChart username and password.    

## 2022-07-11 NOTE — Patient Instructions (Signed)
Please call the [facility] at [phone] to establish care with a Primary Care Provider.   In the meantime, you can access care through the following free and reduced cost health care locations in [place]:   -[facility] ([address], [place], [place] [address]- [phone])  -[facility] ([address], [address], [place] [address]- [phone])  -[facility] ([address], [place], [place] [address]- [phone])  -[facility] ([address], [place]- [phone])  -[facility] [place]- [phone])  

## 2022-07-11 NOTE — Progress Notes (Signed)
Okay per Consuello Masse, NP to proceed with treatment with 175cc urine output.  Will add additional post hydration.    Okay to run hydration concurrently with cisplatin.

## 2022-07-11 NOTE — Progress Notes (Signed)
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  INTERVAL HISTORY: Patient returns to clinic today for further evaluation and consideration of cycle 4 of weekly cisplatin.  He continues with his daily XRT.  The mass on his right neck has decreased in size and there is less drainage.  He declined appointment with wound care.  He has decreased appetite secondary to the taste of food. He does not complain of any pain or dysphagia. He has no neurologic complaints.  He denies any recent fevers or illnesses.  He has no chest pain, shortness of breath, cough, or hemoptysis.  He denies any nausea, vomiting, constipation, or diarrhea.  He has no urinary complaints.  Patient offers no further specific complaints today.  REVIEW OF SYSTEMS:   Review of Systems  Constitutional: Negative.  Negative for fever, malaise/fatigue and weight loss.  HENT:  Negative for sore throat.   Respiratory: Negative.  Negative for cough, hemoptysis and shortness of breath.   Cardiovascular: Negative.  Negative for chest pain and leg swelling.  Gastrointestinal: Negative.  Negative for abdominal pain.  Genitourinary: Negative.  Negative for dysuria.  Musculoskeletal: Negative.  Negative for back pain.  Skin: Negative.  Negative for rash.  Neurological: Negative.  Negative for dizziness, focal weakness, weakness and headaches.  Psychiatric/Behavioral: Negative.  The patient is not nervous/anxious.   As per HPI. Otherwise, a complete review of systems is negative.  PAST MEDICAL HISTORY: History reviewed. No pertinent past medical history.  PAST SURGICAL HISTORY: Past Surgical History:  Procedure Laterality Date   APPENDECTOMY  06/22/20   JOINT REPLACEMENT  02/26/2003   hip   LAPAROSCOPIC APPENDECTOMY N/A  06/23/2020   Procedure: APPENDECTOMY LAPAROSCOPIC;  Surgeon: Duanne Guess, MD;  Location: [facility] ORS;  Service: General;  Laterality: N/A;   TOTAL HIP ARTHROPLASTY Right     FAMILY HISTORY: Family History  Problem Relation Age of Onset   Alzheimer's disease Mother    Stroke Father     ADVANCED DIRECTIVES (Y/N):  N  HEALTH MAINTENANCE: Social History   Tobacco Use   Smoking status: Every Day    Packs/day: .5    Types: Cigarettes   Smokeless tobacco: Never  Vaping Use   Vaping Use: Never used  Substance Use Topics   Alcohol use: Yes    Alcohol/week: 5.0 standard drinks of alcohol    Types: 5 Shots of liquor per week   Drug use: Not Currently    Colonoscopy:  PAP:  Bone density:  Lipid panel:  No Known Allergies  Current Outpatient Medications  Medication Sig Dispense Refill   ondansetron (ZOFRAN) 8 MG tablet Take 1 tablet (8 mg total) by mouth every 8 (eight) hours as needed for nausea or vomiting. Start on the third day after cisplatin. 60 tablet 1   prochlorperazine (COMPAZINE) 10 MG tablet Take 1 tablet (10 mg total) by mouth every 6 (six) hours as needed (Nausea or vomiting). 60 tablet 1   amoxicillin-clavulanate (AUGMENTIN) 875-125 MG tablet Take 1 tablet by mouth 2 (two) times daily. (Patient not taking: Reported on 06/20/2022) 20 tablet 0   No current facility-administered medications for this visit.    OBJECTIVE: Vitals:   07/11/22 0826  BP: 126/65  Pulse: 65  Resp: 18  Temp: (!) 96.8 F (36 C)  SpO2: 99%  Body mass index is 18.56 kg/m.    ECOG FS:1 - Symptomatic but completely ambulatory  General: Well-developed, well-nourished, no acute distress. Eyes: Pink conjunctiva, anicteric sclera. HEENT: Normocephalic, moist mucous membranes.  Mass on right neck present. Per oatient, smaller.  Lungs: No audible wheezing or coughing. Heart: Regular rate and rhythm. Abdomen: Soft, nontender, no obvious distention. Musculoskeletal: No edema, cyanosis, or  clubbing. Neuro: Alert, answering all questions appropriately. Cranial nerves grossly intact. Skin: No rashes or petechiae noted. Psych: Normal affect.   LAB RESULTS: Lab Results  Component Value Date   NA 139 07/11/2022   K 4.7 07/11/2022   CL 104 07/11/2022   CO2 29 07/11/2022   GLUCOSE 180 (H) 07/11/2022   BUN 37 (H) 07/11/2022   CREATININE 0.86 07/11/2022   CALCIUM 9.6 07/11/2022   PROT 7.7 05/28/2022   ALBUMIN 4.2 05/28/2022   AST 18 05/28/2022   ALT 15 05/28/2022   ALKPHOS 69 05/28/2022   BILITOT 0.6 05/28/2022   GFRNONAA >60 07/11/2022    Lab Results  Component Value Date   WBC 7.2 07/11/2022   NEUTROABS 4.8 07/11/2022   HGB 11.3 (L) 07/11/2022   HCT 33.9 (L) 07/11/2022   MCV 94.7 07/11/2022   PLT 240 07/11/2022     STUDIES: No results found.  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  CT does not distinctly indicate a primary lesion.  PET scan results from May 27, 2022 showed large intensely hypermetabolic right neck mass consistent with nodal metastasis.  Patient also noted to have a right hypopharynx/piriform sinus focus which is possibly the primary lesion.  He also was noted to have indeterminate right axillary and right inguinal lymph nodes, unlikely metastasis but will monitor closely.  Labs today reviewed and acceptable for trreatment. Proceed with cisplatin today. He will continue concurrent daily XRT.    Leukocytosis: Resolved. Anemia: Mild, monitor. Neck wound: Slightly improved.  Patient declined wound care clinic appointment.  Monitor. Urinary frequency- likely BPH based on symptoms. He does not currently have pcp. Will give flomax to see if symptoms can be improved. Referral to urology if ongoing evaluation needed. Discussed how to access primary care services.   Disposition - treatment today - f.u  as scheduled   Patient expressed understanding and was in agreement with this plan. He also understands that He can call clinic at any time with any questions, concerns, or complaints.    Cancer Staging  Squamous cell carcinoma of head and neck Staging form: Cervical Lymph Nodes and Unknown Primary Tumors of the Head and Neck, AJCC 8th Edition - Clinical: Stage IVA (cT0, cN2c, cM0) - Signed by Jeralyn Ruths, MD on 06/20/2022   Alinda Dooms, NP   07/11/2022

## 2022-07-11 NOTE — Progress Notes (Signed)
Nutrition Follow-up:  Patient with stage IV squamous cell carcinoma of head of neck (right neck mass), primary lesion not indicated on CT.  Patient receiving concurrent chemotherapy and radiation.   Met with patient in infusion.  Reports that his appetite is a little bit better this past week.  Some foods still have taste bad.  Reports constipation.  Drinking ensure 3 times a day.  Yesterday ate cheeseburger for lunch, oatmeal for breakfast and spaghetti for dinner.  Denies nausea.  States that he has thick saliva.    Medications: reviewed   Labs: reviewed  Anthropometrics:   Weight 125 lb 11.2 oz  131 lb 9.6 oz on 6/20 131 lb 8 oz on 5/20 140 lb about 1 month ago   NUTRITION DIAGNOSIS: Unintentional weight loss continues    INTERVENTION:  Encouraged increasing ensure shakes to QID Continue high calorie, high protein foods Discussed strategies to help with thick saliva.  Handout provided Discussed strategies to help with constipation    MONITORING, EVALUATION, GOAL: weight trends, intake   NEXT VISIT: Thursday, July 11 during infusion  Breena Bevacqua B. Freida Busman, RD, LDN Registered Dietitian [phone]

## 2022-07-12 ENCOUNTER — Other Ambulatory Visit: Payer: Self-pay

## 2022-07-12 ENCOUNTER — Ambulatory Visit
Admission: RE | Admit: 2022-07-12 | Discharge: 2022-07-12 | Disposition: A | Discharge status: Home / Self Care | Payer: Medicare Other | Source: Ambulatory Visit | Attending: Radiation Oncology | Admitting: Radiation Oncology

## 2022-07-12 DIAGNOSIS — C760 Malignant neoplasm of head, face and neck: Secondary | ICD-10-CM | POA: Diagnosis not present

## 2022-07-12 LAB — RAD ONC ARIA SESSION SUMMARY
Course Elapsed Days: 18
Plan Fractions Treated to Date: 3
Plan Prescribed Dose Per Fraction: 2 Gy
Plan Total Fractions Prescribed: 23
Plan Total Prescribed Dose: 46 Gy
Reference Point Dosage Given to Date: 30 Gy
Reference Point Session Dosage Given: 2 Gy
Session Number: 15

## 2022-07-15 ENCOUNTER — Ambulatory Visit
Admission: RE | Admit: 2022-07-15 | Discharge: 2022-07-15 | Disposition: A | Discharge status: Home / Self Care | Payer: Medicare Other | Source: Ambulatory Visit | Attending: Radiation Oncology | Admitting: Radiation Oncology

## 2022-07-15 ENCOUNTER — Other Ambulatory Visit: Payer: Self-pay

## 2022-07-15 DIAGNOSIS — C760 Malignant neoplasm of head, face and neck: Secondary | ICD-10-CM | POA: Diagnosis not present

## 2022-07-15 LAB — RAD ONC ARIA SESSION SUMMARY
Course Elapsed Days: 21
Plan Fractions Treated to Date: 4
Plan Prescribed Dose Per Fraction: 2 Gy
Plan Total Fractions Prescribed: 23
Plan Total Prescribed Dose: 46 Gy
Reference Point Dosage Given to Date: 32 Gy
Reference Point Session Dosage Given: 2 Gy
Session Number: 16

## 2022-07-16 ENCOUNTER — Other Ambulatory Visit: Payer: Self-pay

## 2022-07-16 ENCOUNTER — Ambulatory Visit
Admission: RE | Admit: 2022-07-16 | Discharge: 2022-07-16 | Disposition: A | Discharge status: Home / Self Care | Payer: Medicare Other | Source: Ambulatory Visit | Attending: Radiation Oncology | Admitting: Radiation Oncology

## 2022-07-16 DIAGNOSIS — C760 Malignant neoplasm of head, face and neck: Secondary | ICD-10-CM | POA: Diagnosis not present

## 2022-07-16 LAB — RAD ONC ARIA SESSION SUMMARY
Course Elapsed Days: 22
Plan Fractions Treated to Date: 5
Plan Prescribed Dose Per Fraction: 2 Gy
Plan Total Fractions Prescribed: 23
Plan Total Prescribed Dose: 46 Gy
Reference Point Dosage Given to Date: 34 Gy
Reference Point Session Dosage Given: 2 Gy
Session Number: 17

## 2022-07-17 ENCOUNTER — Other Ambulatory Visit: Payer: Self-pay

## 2022-07-17 ENCOUNTER — Ambulatory Visit
Admission: RE | Admit: 2022-07-17 | Discharge: 2022-07-17 | Disposition: A | Discharge status: Home / Self Care | Payer: Medicare Other | Source: Ambulatory Visit | Attending: Radiation Oncology | Admitting: Radiation Oncology

## 2022-07-17 DIAGNOSIS — C760 Malignant neoplasm of head, face and neck: Secondary | ICD-10-CM | POA: Diagnosis not present

## 2022-07-17 LAB — RAD ONC ARIA SESSION SUMMARY
Course Elapsed Days: 23
Plan Fractions Treated to Date: 6
Plan Prescribed Dose Per Fraction: 2 Gy
Plan Total Fractions Prescribed: 23
Plan Total Prescribed Dose: 46 Gy
Reference Point Dosage Given to Date: 36 Gy
Reference Point Session Dosage Given: 2 Gy
Session Number: 18

## 2022-07-17 MED FILL — Dexamethasone Sodium Phosphate Inj 100 MG/10ML: INTRAMUSCULAR | Qty: 1 | Status: AC

## 2022-07-17 MED FILL — Fosaprepitant Dimeglumine For IV Infusion 150 MG (Base Eq): INTRAVENOUS | Qty: 5 | Status: AC

## 2022-07-19 ENCOUNTER — Inpatient Hospital Stay: Payer: Medicare Other

## 2022-07-19 ENCOUNTER — Encounter: Payer: Self-pay | Admitting: Nurse Practitioner

## 2022-07-19 ENCOUNTER — Inpatient Hospital Stay (HOSPITAL_BASED_OUTPATIENT_CLINIC_OR_DEPARTMENT_OTHER): Payer: Medicare Other | Admitting: Nurse Practitioner

## 2022-07-19 ENCOUNTER — Other Ambulatory Visit: Payer: Self-pay

## 2022-07-19 ENCOUNTER — Ambulatory Visit
Admission: RE | Admit: 2022-07-19 | Discharge: 2022-07-19 | Disposition: A | Discharge status: Home / Self Care | Payer: Medicare Other | Source: Ambulatory Visit | Attending: Radiation Oncology | Admitting: Radiation Oncology

## 2022-07-19 VITALS — BP 140/89 | HR 73 | Resp 18

## 2022-07-19 VITALS — BP 118/63 | HR 91 | Temp 97.0°F | Wt 126.0 lb

## 2022-07-19 DIAGNOSIS — C4442 Squamous cell carcinoma of skin of scalp and neck: Secondary | ICD-10-CM

## 2022-07-19 DIAGNOSIS — R1310 Dysphagia, unspecified: Secondary | ICD-10-CM | POA: Insufficient documentation

## 2022-07-19 DIAGNOSIS — R799 Abnormal finding of blood chemistry, unspecified: Secondary | ICD-10-CM | POA: Insufficient documentation

## 2022-07-19 DIAGNOSIS — R350 Frequency of micturition: Secondary | ICD-10-CM | POA: Insufficient documentation

## 2022-07-19 DIAGNOSIS — C760 Malignant neoplasm of head, face and neck: Secondary | ICD-10-CM | POA: Insufficient documentation

## 2022-07-19 DIAGNOSIS — D649 Anemia, unspecified: Secondary | ICD-10-CM | POA: Insufficient documentation

## 2022-07-19 DIAGNOSIS — F17210 Nicotine dependence, cigarettes, uncomplicated: Secondary | ICD-10-CM | POA: Insufficient documentation

## 2022-07-19 DIAGNOSIS — Z5111 Encounter for antineoplastic chemotherapy: Secondary | ICD-10-CM | POA: Insufficient documentation

## 2022-07-19 DIAGNOSIS — T451X5A Adverse effect of antineoplastic and immunosuppressive drugs, initial encounter: Secondary | ICD-10-CM | POA: Insufficient documentation

## 2022-07-19 DIAGNOSIS — D61810 Antineoplastic chemotherapy induced pancytopenia: Secondary | ICD-10-CM | POA: Insufficient documentation

## 2022-07-19 DIAGNOSIS — E871 Hypo-osmolality and hyponatremia: Secondary | ICD-10-CM | POA: Insufficient documentation

## 2022-07-19 LAB — CBC WITH DIFFERENTIAL (CANCER CENTER ONLY)
Abs Immature Granulocytes: 0.02 10*3/uL (ref 0.00–0.07)
Basophils Absolute: 0.1 10*3/uL (ref 0.0–0.1)
Basophils Relative: 1 %
Eosinophils Absolute: 0.1 10*3/uL (ref 0.0–0.5)
Eosinophils Relative: 3 %
HCT: 34.3 % — ABNORMAL LOW (ref 39.0–52.0)
Hemoglobin: 11.4 g/dL — ABNORMAL LOW (ref 13.0–17.0)
Immature Granulocytes: 1 %
Lymphocytes Relative: 23 %
Lymphs Abs: 0.8 10*3/uL (ref 0.7–4.0)
MCH: 31.6 pg (ref 26.0–34.0)
MCHC: 33.2 g/dL (ref 30.0–36.0)
MCV: 95 fL (ref 80.0–100.0)
Monocytes Absolute: 0.4 10*3/uL (ref 0.1–1.0)
Monocytes Relative: 10 %
Neutro Abs: 2.2 10*3/uL (ref 1.7–7.7)
Neutrophils Relative %: 62 %
Platelet Count: 134 10*3/uL — ABNORMAL LOW (ref 150–400)
RBC: 3.61 MIL/uL — ABNORMAL LOW (ref 4.22–5.81)
RDW: 13.8 % (ref 11.5–15.5)
WBC Count: 3.6 10*3/uL — ABNORMAL LOW (ref 4.0–10.5)
nRBC: 0 % (ref 0.0–0.2)

## 2022-07-19 LAB — BASIC METABOLIC PANEL - CANCER CENTER ONLY
Anion gap: 10 (ref 5–15)
BUN: 42 mg/dL — ABNORMAL HIGH (ref 8–23)
CO2: 26 mmol/L (ref 22–32)
Calcium: 9.3 mg/dL (ref 8.9–10.3)
Chloride: 100 mmol/L (ref 98–111)
Creatinine: 0.99 mg/dL (ref 0.61–1.24)
GFR, Estimated: >60 mL/min (ref >60)
Glucose, Bld: 230 mg/dL — ABNORMAL HIGH (ref 70–99)
Potassium: 4.6 mmol/L (ref 3.5–5.1)
Sodium: 136 mmol/L (ref 135–145)

## 2022-07-19 LAB — RAD ONC ARIA SESSION SUMMARY
Course Elapsed Days: 25
Plan Fractions Treated to Date: 7
Plan Prescribed Dose Per Fraction: 2 Gy
Plan Total Fractions Prescribed: 23
Plan Total Prescribed Dose: 46 Gy
Reference Point Dosage Given to Date: 38 Gy
Reference Point Session Dosage Given: 2 Gy
Session Number: 19

## 2022-07-19 LAB — MAGNESIUM: Magnesium: 2.3 mg/dL (ref 1.7–2.4)

## 2022-07-19 MED ORDER — SODIUM CHLORIDE 0.9 % IV SOLN
10.0000 mg | Freq: Once | INTRAVENOUS | Status: AC
  Administered 2022-07-19: 10 mg via INTRAVENOUS
  Filled 2022-07-19: qty 10

## 2022-07-19 MED ORDER — MAGNESIUM SULFATE 2 GM/50ML IV SOLN
2.0000 g | Freq: Once | INTRAVENOUS | Status: AC
  Administered 2022-07-19: 2 g via INTRAVENOUS
  Filled 2022-07-19: qty 50

## 2022-07-19 MED ORDER — SODIUM CHLORIDE 0.9 % IV SOLN
40.0000 mg/m2 | Freq: Once | INTRAVENOUS | Status: AC
  Administered 2022-07-19: 68 mg via INTRAVENOUS
  Filled 2022-07-19: qty 68

## 2022-07-19 MED ORDER — SODIUM CHLORIDE 0.9 % IV SOLN
Freq: Once | INTRAVENOUS | Status: AC
  Filled 2022-07-19: qty 250

## 2022-07-19 MED ORDER — PALONOSETRON HCL INJECTION 0.25 MG/5ML
0.2500 mg | Freq: Once | INTRAVENOUS | Status: AC
  Administered 2022-07-19: 0.25 mg via INTRAVENOUS
  Filled 2022-07-19: qty 5

## 2022-07-19 MED ORDER — POTASSIUM CHLORIDE IN NACL 20-0.9 MEQ/L-% IV SOLN
Freq: Once | INTRAVENOUS | Status: AC
  Filled 2022-07-19: qty 1000

## 2022-07-19 MED ORDER — SODIUM CHLORIDE 0.9 % IV SOLN
150.0000 mg | Freq: Once | INTRAVENOUS | Status: AC
  Administered 2022-07-19: 150 mg via INTRAVENOUS
  Filled 2022-07-19: qty 150

## 2022-07-19 NOTE — Progress Notes (Signed)
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  He continues with his daily XRT.  Endorses taste changes which are moderate. Calories in form of ensure primarily. Declines feeding tube. Mass has shrunk in size. He does not complain of any pain or dysphagia. He has no neurologic complaints.  He denies any recent fevers or illnesses.  He has no chest pain, shortness of breath, cough, or hemoptysis.  He denies any nausea, vomiting, constipation, or diarrhea.  He has no urinary complaints.  Patient offers no further specific complaints today.  REVIEW OF SYSTEMS:   Review of Systems  Constitutional: Negative.  Negative for fever, malaise/fatigue and weight loss.  HENT:  Negative for sore throat.   Respiratory: Negative.  Negative for cough, hemoptysis and shortness of breath.   Cardiovascular: Negative.  Negative for chest pain and leg swelling.  Gastrointestinal: Negative.  Negative for abdominal pain.  Genitourinary: Negative.  Negative for dysuria.  Musculoskeletal: Negative.  Negative for back pain.  Skin: Negative.  Negative for rash.  Neurological: Negative.  Negative for dizziness, focal weakness, weakness and headaches.  Psychiatric/Behavioral: Negative.  The patient is not nervous/anxious.   As per HPI. Otherwise, a complete review of systems is negative.  PAST MEDICAL HISTORY: History reviewed. No pertinent past medical history.  PAST SURGICAL HISTORY: Past Surgical History:  Procedure Laterality Date   APPENDECTOMY  06/22/20   JOINT REPLACEMENT  02/26/2003   hip   LAPAROSCOPIC APPENDECTOMY N/A 06/23/2020   Procedure: APPENDECTOMY LAPAROSCOPIC;   Surgeon: Duanne Guess, MD;  Location: [facility] ORS;  Service: General;  Laterality: N/A;   TOTAL HIP ARTHROPLASTY Right     FAMILY HISTORY: Family History  Problem Relation Age of Onset   Alzheimer's disease Mother    Stroke Father     ADVANCED DIRECTIVES (Y/N):  N  HEALTH MAINTENANCE: Social History   Tobacco Use   Smoking status: Every Day    Packs/day: .5    Types: Cigarettes   Smokeless tobacco: Never  Vaping Use   Vaping Use: Never used  Substance Use Topics   Alcohol use: Yes    Alcohol/week: 5.0 standard drinks of alcohol    Types: 5 Shots of liquor per week   Drug use: Not Currently    Colonoscopy:  PAP:  Bone density:  Lipid panel:  No Known Allergies  Current Outpatient Medications  Medication Sig Dispense Refill   ondansetron (ZOFRAN) 8 MG tablet Take 1 tablet (8 mg total) by mouth every 8 (eight) hours as needed for nausea or vomiting. Start on the third day after cisplatin. 60 tablet 1   prochlorperazine (COMPAZINE) 10 MG tablet Take 1 tablet (10 mg total) by mouth every 6 (six) hours as needed (Nausea or vomiting). 60 tablet 1   tamsulosin (FLOMAX) 0.4 MG CAPS capsule Take 1 capsule (0.4 mg total) by mouth daily after supper. Once daily for prostate symptoms. If no response in 2-4 weeks, notify clinic for dose change. 30 capsule 1   amoxicillin-clavulanate (AUGMENTIN) 875-125 MG tablet Take 1 tablet by mouth 2 (two) times daily. (Patient not taking: Reported on 06/20/2022) 20 tablet 0   No current facility-administered medications for this visit.    OBJECTIVE:  Vitals:   07/19/22 0830  BP: 118/63  Pulse: 91  Temp: (!) 97 F (36.1 C)  SpO2: 100%     Body mass index is 18.61 kg/m.    ECOG FS:1 - Symptomatic but completely ambulatory  General: Well-developed, well-nourished, no acute distress. Eyes: Pink conjunctiva, anicteric sclera. HEENT: Normocephalic, moist mucous membranes.  Mass on right neck significantly decreased in size. Lungs: No audible  wheezing or coughing. Heart: Regular rate and rhythm. Abdomen: Soft, nontender, no obvious distention. Musculoskeletal: No edema, cyanosis, or clubbing. Neuro: Alert, answering all questions appropriately. Cranial nerves grossly intact. Skin: No rashes or petechiae noted. Psych: Normal affect.   LAB RESULTS: Lab Results  Component Value Date   NA 136 07/19/2022   K 4.6 07/19/2022   CL 100 07/19/2022   CO2 26 07/19/2022   GLUCOSE 230 (H) 07/19/2022   BUN 42 (H) 07/19/2022   CREATININE 0.99 07/19/2022   CALCIUM 9.3 07/19/2022   PROT 7.7 05/28/2022   ALBUMIN 4.2 05/28/2022   AST 18 05/28/2022   ALT 15 05/28/2022   ALKPHOS 69 05/28/2022   BILITOT 0.6 05/28/2022   GFRNONAA >60 07/19/2022    Lab Results  Component Value Date   WBC 3.6 (L) 07/19/2022   NEUTROABS 2.2 07/19/2022   HGB 11.4 (L) 07/19/2022   HCT 34.3 (L) 07/19/2022   MCV 95.0 07/19/2022   PLT 134 (L) 07/19/2022     STUDIES: No results found.  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  CT does not distinctly indicate a primary lesion.  PET scan results from May 27, 2022 reviewed independently which large intensely hypermetabolic right neck mass consistent with nodal metastasis.  Patient also noted to have a right hypopharynx/piriform sinus focus which is possibly the primary lesion.  He also was noted to have indeterminate right axillary and right inguinal lymph nodes, unlikely metastasis but will monitor closely.  Patient does not require port placement, but would consider one in the future if IV access became an issue.  Continue with daily XRT.  Labs reviewed and acceptable for treatment. Proceed with cycle 5 of treatment today.  Return to clinic in 1 week for further evaluation and consideration of cycle 6.   Leukocytosis: Resolved. Anemia:  Mild, monitor.. Thrombocytopenia- d/t chemo. Monitor.  Elevated BUN- likely related to limited intake. Add interval IVF.  Neck wound: improving. Previously declined wound referral Urinary frequency- likely BPH based on symptoms. He does not currently have pcp. Provided with tamsulosin 0.4 mg at last appt.   Disposition Treatment today Rtc on Wednesday for ivf  1 week- lab (cbc, cmp), Dr Orlie Dakin, +/- cisplatin- la   Patient expressed understanding and was in agreement with this plan. He also understands that He can call clinic at any time with any questions, concerns, or complaints.    Cancer Staging  Squamous cell carcinoma of head and neck Staging form: Cervical Lymph Nodes and Unknown Primary Tumors of the Head and Neck, AJCC 8th Edition - Clinical: Stage IVA (cT0, cN2c, cM0) - Signed by Jeralyn Ruths, MD on 06/20/2022   Alinda Dooms, NP   07/19/2022 8:39 AM

## 2022-07-19 NOTE — Patient Instructions (Signed)
[facility]  Discharge Instructions: Thank you for choosing [facility] to provide your oncology and hematology care.  If you have a lab appointment with the Cancer Center, please go directly to the Cancer Center and check in at the registration area.  Wear comfortable clothing and clothing appropriate for easy access to any Portacath or PICC line.   We strive to give you quality time with your provider. You may need to reschedule your appointment if you arrive late (15 or more minutes).  Arriving late affects you and other patients whose appointments are after yours.  Also, if you miss three or more appointments without notifying the office, you may be dismissed from the clinic at the provider's discretion.      For prescription refill requests, have your pharmacy contact our office and allow 72 hours for refills to be completed.    Today you received the following chemotherapy and/or immunotherapy agents Cisplatin      To help prevent nausea and vomiting after your treatment, we encourage you to take your nausea medication as directed.  BELOW ARE SYMPTOMS THAT SHOULD BE REPORTED IMMEDIATELY: *FEVER GREATER THAN 100.4 F (38 C) OR HIGHER *CHILLS OR SWEATING *NAUSEA AND VOMITING THAT IS NOT CONTROLLED WITH YOUR NAUSEA MEDICATION *UNUSUAL SHORTNESS OF BREATH *UNUSUAL BRUISING OR BLEEDING *URINARY PROBLEMS (pain or burning when urinating, or frequent urination) *BOWEL PROBLEMS (unusual diarrhea, constipation, pain near the anus) TENDERNESS IN MOUTH AND THROAT WITH OR WITHOUT PRESENCE OF ULCERS (sore throat, sores in mouth, or a toothache) UNUSUAL RASH, SWELLING OR PAIN  UNUSUAL VAGINAL DISCHARGE OR ITCHING   Items with * indicate a potential emergency and should be followed up as soon as possible or go to the Emergency Department if any problems should occur.  Please show the CHEMOTHERAPY ALERT CARD or IMMUNOTHERAPY ALERT CARD at check-in to  the Emergency Department and triage nurse.  Should you have questions after your visit or need to cancel or reschedule your appointment, please contact [facility] [facility]  [phone] and follow the prompts.  Office hours are 8:00 a.m. to 4:30 p.m. Monday - Friday. Please note that voicemails left after 4:00 p.m. may not be returned until the following business day.  We are closed weekends and major holidays. You have access to a nurse at all times for urgent questions. Please call the main number to the clinic [phone] and follow the prompts.  For any non-urgent questions, you may also contact your provider using MyChart. We now offer e-Visits for anyone 18 and older to request care online for non-urgent symptoms. For details visit [email or web address].   Also download the MyChart app! Go to the app store, search "MyChart", open the app, select [facility], and log in with your MyChart username and password.    

## 2022-07-19 NOTE — Progress Notes (Signed)
Around 1140:Allen, NP was notified that patient was unable to meet the urine requirement. Extra fluids were given at of NS and continued the potassium fluid for another hour.  1200: patient has met his urine output to proceed with treatment.  1400: MD okay with starting post fluids with treatment.

## 2022-07-22 ENCOUNTER — Ambulatory Visit
Admission: RE | Admit: 2022-07-22 | Discharge: 2022-07-22 | Disposition: A | Discharge status: Home / Self Care | Payer: Medicare Other | Source: Ambulatory Visit | Attending: Radiation Oncology | Admitting: Radiation Oncology

## 2022-07-22 ENCOUNTER — Other Ambulatory Visit: Payer: Self-pay

## 2022-07-22 DIAGNOSIS — C760 Malignant neoplasm of head, face and neck: Secondary | ICD-10-CM | POA: Diagnosis not present

## 2022-07-22 LAB — RAD ONC ARIA SESSION SUMMARY
Course Elapsed Days: 28
Plan Fractions Treated to Date: 8
Plan Prescribed Dose Per Fraction: 2 Gy
Plan Total Fractions Prescribed: 23
Plan Total Prescribed Dose: 46 Gy
Reference Point Dosage Given to Date: 40 Gy
Reference Point Session Dosage Given: 2 Gy
Session Number: 20

## 2022-07-23 ENCOUNTER — Ambulatory Visit
Admission: RE | Admit: 2022-07-23 | Discharge: 2022-07-23 | Disposition: A | Discharge status: Home / Self Care | Payer: Medicare Other | Source: Ambulatory Visit | Attending: Radiation Oncology | Admitting: Radiation Oncology

## 2022-07-23 ENCOUNTER — Other Ambulatory Visit: Payer: Self-pay

## 2022-07-23 DIAGNOSIS — C760 Malignant neoplasm of head, face and neck: Secondary | ICD-10-CM | POA: Diagnosis not present

## 2022-07-23 LAB — RAD ONC ARIA SESSION SUMMARY
Course Elapsed Days: 29
Plan Fractions Treated to Date: 9
Plan Prescribed Dose Per Fraction: 2 Gy
Plan Total Fractions Prescribed: 23
Plan Total Prescribed Dose: 46 Gy
Reference Point Dosage Given to Date: 42 Gy
Reference Point Session Dosage Given: 2 Gy
Session Number: 21

## 2022-07-24 ENCOUNTER — Other Ambulatory Visit: Payer: Self-pay

## 2022-07-24 ENCOUNTER — Ambulatory Visit
Admission: RE | Admit: 2022-07-24 | Discharge: 2022-07-24 | Disposition: A | Discharge status: Home / Self Care | Payer: Medicare Other | Source: Ambulatory Visit | Attending: Radiation Oncology | Admitting: Radiation Oncology

## 2022-07-24 ENCOUNTER — Inpatient Hospital Stay: Payer: Medicare Other

## 2022-07-24 VITALS — BP 133/75 | HR 79 | Temp 97.2°F | Resp 20

## 2022-07-24 DIAGNOSIS — C4442 Squamous cell carcinoma of skin of scalp and neck: Secondary | ICD-10-CM

## 2022-07-24 DIAGNOSIS — C760 Malignant neoplasm of head, face and neck: Secondary | ICD-10-CM | POA: Diagnosis not present

## 2022-07-24 LAB — RAD ONC ARIA SESSION SUMMARY
Course Elapsed Days: 30
Plan Fractions Treated to Date: 10
Plan Prescribed Dose Per Fraction: 2 Gy
Plan Total Fractions Prescribed: 23
Plan Total Prescribed Dose: 46 Gy
Reference Point Dosage Given to Date: 44 Gy
Reference Point Session Dosage Given: 2 Gy
Session Number: 22

## 2022-07-24 MED ORDER — SODIUM CHLORIDE 0.9 % IV SOLN
Freq: Once | INTRAVENOUS | Status: AC
  Filled 2022-07-24: qty 250

## 2022-07-24 MED FILL — Dexamethasone Sodium Phosphate Inj 100 MG/10ML: INTRAMUSCULAR | Qty: 1 | Status: AC

## 2022-07-25 ENCOUNTER — Other Ambulatory Visit: Payer: Self-pay

## 2022-07-25 ENCOUNTER — Inpatient Hospital Stay: Payer: Medicare Other

## 2022-07-25 ENCOUNTER — Encounter: Payer: Self-pay | Admitting: Oncology

## 2022-07-25 ENCOUNTER — Ambulatory Visit
Admission: RE | Admit: 2022-07-25 | Discharge: 2022-07-25 | Disposition: A | Discharge status: Home / Self Care | Payer: Medicare Other | Source: Ambulatory Visit | Attending: Radiation Oncology | Admitting: Radiation Oncology

## 2022-07-25 ENCOUNTER — Inpatient Hospital Stay (HOSPITAL_BASED_OUTPATIENT_CLINIC_OR_DEPARTMENT_OTHER): Payer: Medicare Other | Admitting: Oncology

## 2022-07-25 VITALS — BP 123/51 | HR 91 | Temp 95.6°F | Ht 69.0 in | Wt 133.2 lb

## 2022-07-25 DIAGNOSIS — C4442 Squamous cell carcinoma of skin of scalp and neck: Secondary | ICD-10-CM

## 2022-07-25 DIAGNOSIS — C760 Malignant neoplasm of head, face and neck: Secondary | ICD-10-CM | POA: Diagnosis not present

## 2022-07-25 LAB — CBC WITH DIFFERENTIAL (CANCER CENTER ONLY)
Abs Immature Granulocytes: 0.02 10*3/uL (ref 0.00–0.07)
Basophils Absolute: 0 10*3/uL (ref 0.0–0.1)
Basophils Relative: 1 %
Eosinophils Absolute: 0.1 10*3/uL (ref 0.0–0.5)
Eosinophils Relative: 3 %
HCT: 32.6 % — ABNORMAL LOW (ref 39.0–52.0)
Hemoglobin: 11 g/dL — ABNORMAL LOW (ref 13.0–17.0)
Immature Granulocytes: 1 %
Lymphocytes Relative: 28 %
Lymphs Abs: 0.9 10*3/uL (ref 0.7–4.0)
MCH: 32.2 pg (ref 26.0–34.0)
MCHC: 33.7 g/dL (ref 30.0–36.0)
MCV: 95.3 fL (ref 80.0–100.0)
Monocytes Absolute: 0.4 10*3/uL (ref 0.1–1.0)
Monocytes Relative: 14 %
Neutro Abs: 1.6 10*3/uL — ABNORMAL LOW (ref 1.7–7.7)
Neutrophils Relative %: 53 %
Platelet Count: 92 10*3/uL — ABNORMAL LOW (ref 150–400)
RBC: 3.42 MIL/uL — ABNORMAL LOW (ref 4.22–5.81)
RDW: 14.2 % (ref 11.5–15.5)
WBC Count: 3 10*3/uL — ABNORMAL LOW (ref 4.0–10.5)
nRBC: 0 % (ref 0.0–0.2)

## 2022-07-25 LAB — RAD ONC ARIA SESSION SUMMARY
Course Elapsed Days: 31
Plan Fractions Treated to Date: 11
Plan Prescribed Dose Per Fraction: 2 Gy
Plan Total Fractions Prescribed: 23
Plan Total Prescribed Dose: 46 Gy
Reference Point Dosage Given to Date: 46 Gy
Reference Point Session Dosage Given: 2 Gy
Session Number: 23

## 2022-07-25 LAB — BASIC METABOLIC PANEL - CANCER CENTER ONLY
Anion gap: 8 (ref 5–15)
BUN: 35 mg/dL — ABNORMAL HIGH (ref 8–23)
CO2: 25 mmol/L (ref 22–32)
Calcium: 9.1 mg/dL (ref 8.9–10.3)
Chloride: 101 mmol/L (ref 98–111)
Creatinine: 0.78 mg/dL (ref 0.61–1.24)
GFR, Estimated: >60 mL/min (ref >60)
Glucose, Bld: 171 mg/dL — ABNORMAL HIGH (ref 70–99)
Potassium: 4.2 mmol/L (ref 3.5–5.1)
Sodium: 134 mmol/L — ABNORMAL LOW (ref 135–145)

## 2022-07-25 LAB — MAGNESIUM: Magnesium: 2 mg/dL (ref 1.7–2.4)

## 2022-07-25 NOTE — Progress Notes (Signed)
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  INTERVAL HISTORY: Patient returns to clinic today for further evaluation and consideration of cycle 6 of weekly cisplatin.  He continues to have dysphagia and dry mouth, but otherwise is tolerating his treatments well.  The mass on the right side of his neck is nearly resolved.  He has no neurologic complaints.  He denies any recent fevers or illnesses.  He has no chest pain, shortness of breath, cough, or hemoptysis.  He denies any nausea, vomiting, constipation, or diarrhea.  He has no urinary complaints.  Patient offers no further specific complaints today.  REVIEW OF SYSTEMS:   Review of Systems  Constitutional: Negative.  Negative for fever, malaise/fatigue and weight loss.  HENT:  Negative for sore throat.   Respiratory: Negative.  Negative for cough, hemoptysis and shortness of breath.   Cardiovascular: Negative.  Negative for chest pain and leg swelling.  Gastrointestinal: Negative.  Negative for abdominal pain.  Genitourinary: Negative.  Negative for dysuria.  Musculoskeletal: Negative.  Negative for back pain.  Skin: Negative.  Negative for rash.  Neurological: Negative.  Negative for dizziness, focal weakness, weakness and headaches.  Psychiatric/Behavioral: Negative.  The patient is not nervous/anxious.     As per HPI. Otherwise, a complete review of systems is negative.  PAST MEDICAL HISTORY: History reviewed. No pertinent past medical history.  PAST SURGICAL HISTORY: Past Surgical History:  Procedure Laterality Date   APPENDECTOMY  06/22/20   JOINT REPLACEMENT  02/26/2003   hip   LAPAROSCOPIC APPENDECTOMY N/A 06/23/2020   Procedure: APPENDECTOMY LAPAROSCOPIC;  Surgeon: Duanne Guess, MD;  Location: [facility] ORS;   Service: General;  Laterality: N/A;   TOTAL HIP ARTHROPLASTY Right     FAMILY HISTORY: Family History  Problem Relation Age of Onset   Alzheimer's disease Mother    Stroke Father     ADVANCED DIRECTIVES (Y/N):  N  HEALTH MAINTENANCE: Social History   Tobacco Use   Smoking status: Every Day    Current packs/day: 0.50    Types: Cigarettes   Smokeless tobacco: Never  Vaping Use   Vaping status: Never Used  Substance Use Topics   Alcohol use: Yes    Alcohol/week: 5.0 standard drinks of alcohol    Types: 5 Shots of liquor per week   Drug use: Not Currently     Colonoscopy:  PAP:  Bone density:  Lipid panel:  No Known Allergies  Current Outpatient Medications  Medication Sig Dispense Refill   ondansetron (ZOFRAN) 8 MG tablet Take 1 tablet (8 mg total) by mouth every 8 (eight) hours as needed for nausea or vomiting. Start on the third day after cisplatin. 60 tablet 1   prochlorperazine (COMPAZINE) 10 MG tablet Take 1 tablet (10 mg total) by mouth every 6 (six) hours as needed (Nausea or vomiting). 60 tablet 1   tamsulosin (FLOMAX) 0.4 MG CAPS capsule Take 1 capsule (0.4 mg total) by mouth daily after supper. Once daily for prostate symptoms. If no response in 2-4 weeks, notify clinic for dose change. 30 capsule 1   No current facility-administered medications for this visit.    OBJECTIVE: Vitals:   07/25/22 0819  BP: (!) 123/51  Pulse: 91  Temp: (!) 95.6 F (35.3 C)  SpO2: 100%     Body mass  index is 19.67 kg/m.    ECOG FS:0 - Asymptomatic  General: Well-developed, well-nourished, no acute distress. Eyes: Pink conjunctiva, anicteric sclera. HEENT: Normocephalic, moist mucous membranes.  Mass nearly resolved. Lungs: No audible wheezing or coughing. Heart: Regular rate and rhythm. Abdomen: Soft, nontender, no obvious distention. Musculoskeletal: No edema, cyanosis, or clubbing. Neuro: Alert, answering all questions appropriately. Cranial nerves grossly  intact. Skin: No rashes or petechiae noted. Psych: Normal affect.  LAB RESULTS:  Lab Results  Component Value Date   NA 134 (L) 07/25/2022   K 4.2 07/25/2022   CL 101 07/25/2022   CO2 25 07/25/2022   GLUCOSE 171 (H) 07/25/2022   BUN 35 (H) 07/25/2022   CREATININE 0.78 07/25/2022   CALCIUM 9.1 07/25/2022   PROT 7.7 05/28/2022   ALBUMIN 4.2 05/28/2022   AST 18 05/28/2022   ALT 15 05/28/2022   ALKPHOS 69 05/28/2022   BILITOT 0.6 05/28/2022   GFRNONAA >60 07/25/2022    Lab Results  Component Value Date   WBC 3.0 (L) 07/25/2022   NEUTROABS 1.6 (L) 07/25/2022   HGB 11.0 (L) 07/25/2022   HCT 32.6 (L) 07/25/2022   MCV 95.3 07/25/2022   PLT 92 (L) 07/25/2022     STUDIES: No results found.  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  CT does not distinctly indicate a primary lesion.  PET scan results from May 27, 2022 reviewed independently which large intensely hypermetabolic right neck mass consistent with nodal metastasis.  Patient also noted to have a right hypopharynx/piriform sinus focus which is possibly the primary lesion.  He also was noted to have indeterminate right axillary and right inguinal lymph nodes, unlikely metastasis but will monitor closely.   Patient does not require port placement, but would consider one in the future if IV access became an issue.  Continue with daily XRT completing treatment on August 12, 2022.  Will hold cisplatin this week secondary to thrombocytopenia.  Return to clinic in 1 week for further evaluation and reconsideration of cycle 6.   Leukocytosis: Mild, monitor. Anemia: Mild, monitor.  Patient's hemoglobin has trended down slightly to 11.0. Thrombocytopenia: Patient platelet count is 92, hold treatment as above. Neck wound: Resolved. Hyponatremia: Mild,  monitor.  Patient expressed understanding and was in agreement with this plan. He also understands that He can call clinic at any time with any questions, concerns, or complaints.    Cancer Staging  Squamous cell carcinoma of head and neck Staging form: Cervical Lymph Nodes and Unknown Primary Tumors of the Head and Neck, AJCC 8th Edition - Clinical: Stage IVA (cT0, cN2c, cM0) - Signed by Jeralyn Ruths, MD on 06/20/2022   Jeralyn Ruths, MD   07/25/2022 2:32 PM

## 2022-07-25 NOTE — Progress Notes (Signed)
Nutrition Follow-up:  Patient with stage IV squamous cell carcinoma of head and neck (right neck mass).  Chemotherapy was held today due to thrombocytopenia.    Called and spoke with patient via phone since infusion was cancelled for today.  Reports that he is mostly drinking 5-6 ensure shakes a day (1700 calories). Foods taste like "pig vomit" per patient.  Drinking water as well but his coffee taste bad.  Denies pain swallowing foods, some sores in mouth from radiation.  Has thick, stringy saliva.  Reports last bowel movement 2 days ago.    Medications: reviewed  Labs: reviewed  Anthropometrics:   Weight 133 lb 3.2 oz today 125 lb on 6/27 131 lb on 6/20 140 lb about 1 month ago   NUTRITION DIAGNOSIS: Unintentional weight loss stable    INTERVENTION:  Reviewed strategies to help with dry mouth.  Patient says that he is using baking soda and salt water rinse Encouraged 350 calorie shake 5-6 times a day to provide calories and protein     MONITORING, EVALUATION, GOAL: weight trends, intake   NEXT VISIT: Thursday, July 17 during infusion  Ambri Miltner B. Freida Busman, RD, LDN Registered Dietitian [phone]

## 2022-07-26 ENCOUNTER — Ambulatory Visit
Admission: RE | Admit: 2022-07-26 | Discharge: 2022-07-26 | Disposition: A | Discharge status: Home / Self Care | Payer: Medicare Other | Source: Ambulatory Visit | Attending: Radiation Oncology | Admitting: Radiation Oncology

## 2022-07-26 ENCOUNTER — Other Ambulatory Visit: Payer: Self-pay

## 2022-07-26 DIAGNOSIS — C760 Malignant neoplasm of head, face and neck: Secondary | ICD-10-CM | POA: Diagnosis not present

## 2022-07-26 LAB — RAD ONC ARIA SESSION SUMMARY
Course Elapsed Days: 32
Plan Fractions Treated to Date: 12
Plan Prescribed Dose Per Fraction: 2 Gy
Plan Total Fractions Prescribed: 23
Plan Total Prescribed Dose: 46 Gy
Reference Point Dosage Given to Date: 48 Gy
Reference Point Session Dosage Given: 2 Gy
Session Number: 24

## 2022-07-29 ENCOUNTER — Other Ambulatory Visit: Payer: Self-pay

## 2022-07-29 ENCOUNTER — Ambulatory Visit
Admission: RE | Admit: 2022-07-29 | Discharge: 2022-07-29 | Disposition: A | Discharge status: Home / Self Care | Payer: Medicare Other | Source: Ambulatory Visit | Attending: Radiation Oncology | Admitting: Radiation Oncology

## 2022-07-29 DIAGNOSIS — C760 Malignant neoplasm of head, face and neck: Secondary | ICD-10-CM | POA: Diagnosis not present

## 2022-07-29 LAB — RAD ONC ARIA SESSION SUMMARY
Course Elapsed Days: 35
Plan Fractions Treated to Date: 13
Plan Prescribed Dose Per Fraction: 2 Gy
Plan Total Fractions Prescribed: 23
Plan Total Prescribed Dose: 46 Gy
Reference Point Dosage Given to Date: 50 Gy
Reference Point Session Dosage Given: 2 Gy
Session Number: 25

## 2022-07-30 ENCOUNTER — Ambulatory Visit: Admission: RE | Admit: 2022-07-30 | Payer: Medicare Other | Source: Ambulatory Visit

## 2022-07-30 ENCOUNTER — Other Ambulatory Visit: Payer: Self-pay

## 2022-07-30 DIAGNOSIS — C760 Malignant neoplasm of head, face and neck: Secondary | ICD-10-CM | POA: Diagnosis not present

## 2022-07-30 LAB — RAD ONC ARIA SESSION SUMMARY
Course Elapsed Days: 36
Plan Fractions Treated to Date: 14
Plan Prescribed Dose Per Fraction: 2 Gy
Plan Total Fractions Prescribed: 23
Plan Total Prescribed Dose: 46 Gy
Reference Point Dosage Given to Date: 52 Gy
Reference Point Session Dosage Given: 2 Gy
Session Number: 26

## 2022-07-31 ENCOUNTER — Other Ambulatory Visit: Payer: Self-pay

## 2022-07-31 ENCOUNTER — Ambulatory Visit
Admission: RE | Admit: 2022-07-31 | Discharge: 2022-07-31 | Disposition: A | Discharge status: Home / Self Care | Payer: Medicare Other | Source: Ambulatory Visit | Attending: Radiation Oncology | Admitting: Radiation Oncology

## 2022-07-31 DIAGNOSIS — C760 Malignant neoplasm of head, face and neck: Secondary | ICD-10-CM | POA: Diagnosis not present

## 2022-07-31 LAB — RAD ONC ARIA SESSION SUMMARY
Course Elapsed Days: 37
Plan Fractions Treated to Date: 15
Plan Prescribed Dose Per Fraction: 2 Gy
Plan Total Fractions Prescribed: 23
Plan Total Prescribed Dose: 46 Gy
Reference Point Dosage Given to Date: 54 Gy
Reference Point Session Dosage Given: 2 Gy
Session Number: 27

## 2022-07-31 MED FILL — Dexamethasone Sodium Phosphate Inj 100 MG/10ML: INTRAMUSCULAR | Qty: 1 | Status: AC

## 2022-07-31 MED FILL — Fosaprepitant Dimeglumine For IV Infusion 150 MG (Base Eq): INTRAVENOUS | Qty: 5 | Status: AC

## 2022-08-01 ENCOUNTER — Ambulatory Visit
Admission: RE | Admit: 2022-08-01 | Discharge: 2022-08-01 | Disposition: A | Discharge status: Home / Self Care | Payer: Medicare Other | Source: Ambulatory Visit | Attending: Radiation Oncology | Admitting: Radiation Oncology

## 2022-08-01 ENCOUNTER — Inpatient Hospital Stay: Payer: Medicare Other | Admitting: Oncology

## 2022-08-01 ENCOUNTER — Encounter: Payer: Self-pay | Admitting: Oncology

## 2022-08-01 ENCOUNTER — Other Ambulatory Visit: Payer: Self-pay

## 2022-08-01 ENCOUNTER — Inpatient Hospital Stay: Payer: Medicare Other

## 2022-08-01 VITALS — BP 140/67 | HR 80 | Resp 18

## 2022-08-01 DIAGNOSIS — C760 Malignant neoplasm of head, face and neck: Secondary | ICD-10-CM | POA: Diagnosis not present

## 2022-08-01 DIAGNOSIS — C4442 Squamous cell carcinoma of skin of scalp and neck: Secondary | ICD-10-CM | POA: Diagnosis not present

## 2022-08-01 LAB — RAD ONC ARIA SESSION SUMMARY
Course Elapsed Days: 38
Plan Fractions Treated to Date: 16
Plan Prescribed Dose Per Fraction: 2 Gy
Plan Total Fractions Prescribed: 23
Plan Total Prescribed Dose: 46 Gy
Reference Point Dosage Given to Date: 56 Gy
Reference Point Session Dosage Given: 2 Gy
Session Number: 28

## 2022-08-01 LAB — CBC WITH DIFFERENTIAL (CANCER CENTER ONLY)
Abs Immature Granulocytes: 0.02 10*3/uL (ref 0.00–0.07)
Basophils Absolute: 0 10*3/uL (ref 0.0–0.1)
Basophils Relative: 1 %
Eosinophils Absolute: 0.1 10*3/uL (ref 0.0–0.5)
Eosinophils Relative: 4 %
HCT: 31.4 % — ABNORMAL LOW (ref 39.0–52.0)
Hemoglobin: 10.7 g/dL — ABNORMAL LOW (ref 13.0–17.0)
Immature Granulocytes: 1 %
Lymphocytes Relative: 22 %
Lymphs Abs: 0.6 10*3/uL — ABNORMAL LOW (ref 0.7–4.0)
MCH: 32.3 pg (ref 26.0–34.0)
MCHC: 34.1 g/dL (ref 30.0–36.0)
MCV: 94.9 fL (ref 80.0–100.0)
Monocytes Absolute: 0.7 10*3/uL (ref 0.1–1.0)
Monocytes Relative: 25 %
Neutro Abs: 1.3 10*3/uL — ABNORMAL LOW (ref 1.7–7.7)
Neutrophils Relative %: 47 %
Platelet Count: 152 10*3/uL (ref 150–400)
RBC: 3.31 MIL/uL — ABNORMAL LOW (ref 4.22–5.81)
RDW: 14.8 % (ref 11.5–15.5)
WBC Count: 2.8 10*3/uL — ABNORMAL LOW (ref 4.0–10.5)
nRBC: 0 % (ref 0.0–0.2)

## 2022-08-01 LAB — BASIC METABOLIC PANEL - CANCER CENTER ONLY
Anion gap: 12 (ref 5–15)
BUN: 38 mg/dL — ABNORMAL HIGH (ref 8–23)
CO2: 23 mmol/L (ref 22–32)
Calcium: 9.2 mg/dL (ref 8.9–10.3)
Chloride: 103 mmol/L (ref 98–111)
Creatinine: 0.92 mg/dL (ref 0.61–1.24)
GFR, Estimated: >60 mL/min (ref >60)
Glucose, Bld: 111 mg/dL — ABNORMAL HIGH (ref 70–99)
Potassium: 4.3 mmol/L (ref 3.5–5.1)
Sodium: 138 mmol/L (ref 135–145)

## 2022-08-01 LAB — MAGNESIUM: Magnesium: 2.2 mg/dL (ref 1.7–2.4)

## 2022-08-01 MED ORDER — MAGNESIUM SULFATE 2 GM/50ML IV SOLN
2.0000 g | Freq: Once | INTRAVENOUS | Status: AC
  Administered 2022-08-01: 2 g via INTRAVENOUS
  Filled 2022-08-01: qty 50

## 2022-08-01 MED ORDER — SODIUM CHLORIDE 0.9 % IV SOLN
10.0000 mg | Freq: Once | INTRAVENOUS | Status: AC
  Administered 2022-08-01: 10 mg via INTRAVENOUS
  Filled 2022-08-01: qty 10
  Filled 2022-08-01: qty 1

## 2022-08-01 MED ORDER — POTASSIUM CHLORIDE IN NACL 20-0.9 MEQ/L-% IV SOLN
Freq: Once | INTRAVENOUS | Status: AC
  Filled 2022-08-01: qty 1000

## 2022-08-01 MED ORDER — SODIUM CHLORIDE 0.9 % IV SOLN
40.0000 mg/m2 | Freq: Once | INTRAVENOUS | Status: AC
  Administered 2022-08-01: 68 mg via INTRAVENOUS
  Filled 2022-08-01: qty 68

## 2022-08-01 MED ORDER — PALONOSETRON HCL INJECTION 0.25 MG/5ML
0.2500 mg | Freq: Once | INTRAVENOUS | Status: AC
  Administered 2022-08-01: 0.25 mg via INTRAVENOUS
  Filled 2022-08-01: qty 5

## 2022-08-01 MED ORDER — SODIUM CHLORIDE 0.9 % IV SOLN
150.0000 mg | Freq: Once | INTRAVENOUS | Status: AC
  Administered 2022-08-01: 150 mg via INTRAVENOUS
  Filled 2022-08-01: qty 150
  Filled 2022-08-01: qty 5

## 2022-08-01 MED ORDER — SODIUM CHLORIDE 0.9 % IV SOLN
Freq: Once | INTRAVENOUS | Status: AC
  Filled 2022-08-01: qty 250

## 2022-08-01 NOTE — Progress Notes (Signed)
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  INTERVAL HISTORY: Patient returns to clinic today for further evaluation and reconsideration of cycle 6 of weekly cisplatin.  He continues to have a mild dysphagia and dry mouth, but otherwise feels well and is tolerating his treatments. The mass on the right side of his neck is nearly resolved.  He has no neurologic complaints.  He denies any recent fevers or illnesses.  He has no chest pain, shortness of breath, cough, or hemoptysis.  He denies any nausea, vomiting, constipation, or diarrhea.  He has no urinary complaints.  Patient offers no further specific complaints today.  REVIEW OF SYSTEMS:   Review of Systems  Constitutional: Negative.  Negative for fever, malaise/fatigue and weight loss.  HENT:  Negative for sore throat.   Respiratory: Negative.  Negative for cough, hemoptysis and shortness of breath.   Cardiovascular: Negative.  Negative for chest pain and leg swelling.  Gastrointestinal: Negative.  Negative for abdominal pain.  Genitourinary: Negative.  Negative for dysuria.  Musculoskeletal: Negative.  Negative for back pain.  Skin: Negative.  Negative for rash.  Neurological: Negative.  Negative for dizziness, focal weakness, weakness and headaches.  Psychiatric/Behavioral: Negative.  The patient is not nervous/anxious.     As per HPI. Otherwise, a complete review of systems is negative.  PAST MEDICAL HISTORY: History reviewed. No pertinent past medical history.  PAST SURGICAL HISTORY: Past Surgical History:  Procedure Laterality Date   APPENDECTOMY  06/22/20   JOINT REPLACEMENT  02/26/2003   hip   LAPAROSCOPIC APPENDECTOMY N/A 06/23/2020   Procedure: APPENDECTOMY LAPAROSCOPIC;  Surgeon: Duanne Guess, MD;   Location: [facility] ORS;  Service: General;  Laterality: N/A;   TOTAL HIP ARTHROPLASTY Right     FAMILY HISTORY: Family History  Problem Relation Age of Onset   Alzheimer's disease Mother    Stroke Father     ADVANCED DIRECTIVES (Y/N):  N  HEALTH MAINTENANCE: Social History   Tobacco Use   Smoking status: Every Day    Current packs/day: 0.50    Types: Cigarettes   Smokeless tobacco: Never  Vaping Use   Vaping status: Never Used  Substance Use Topics   Alcohol use: Yes    Alcohol/week: 5.0 standard drinks of alcohol    Types: 5 Shots of liquor per week   Drug use: Not Currently     Colonoscopy:  PAP:  Bone density:  Lipid panel:  No Known Allergies  Current Outpatient Medications  Medication Sig Dispense Refill   ondansetron (ZOFRAN) 8 MG tablet Take 1 tablet (8 mg total) by mouth every 8 (eight) hours as needed for nausea or vomiting. Start on the third day after cisplatin. 60 tablet 1   polyethylene glycol (MIRALAX / GLYCOLAX) 17 g packet Take 17 g by mouth daily.     prochlorperazine (COMPAZINE) 10 MG tablet Take 1 tablet (10 mg total) by mouth every 6 (six) hours as needed (Nausea or vomiting). 60 tablet 1   tamsulosin (FLOMAX) 0.4 MG CAPS capsule Take 1 capsule (0.4 mg total) by mouth daily after supper. Once daily for prostate symptoms. If no response in 2-4 weeks, notify clinic for dose change. 30 capsule 1   No current facility-administered medications for this visit.   Facility-Administered Medications Ordered in Other Visits  Medication Dose  Route Frequency Provider Last Rate Last Admin   0.9 %  sodium chloride infusion   Intravenous Once Orlie Dakin, Tollie Pizza, MD       0.9 % NaCl with KCl 20 mEq/ L  infusion   Intravenous Once Orlie Dakin, Tollie Pizza, MD       CISplatin (PLATINOL) 68 mg in sodium chloride 0.9 % 250 mL chemo infusion  40 mg/m2 (Treatment Plan Recorded) Intravenous Once Jeralyn Ruths, MD       dexamethasone (DECADRON) 10 mg in sodium chloride 0.9  % 50 mL IVPB  10 mg Intravenous Once Jeralyn Ruths, MD       fosaprepitant (EMEND) 150 mg in sodium chloride 0.9 % 145 mL IVPB  150 mg Intravenous Once Jeralyn Ruths, MD       magnesium sulfate IVPB 2 g 50 mL  2 g Intravenous Once Jeralyn Ruths, MD       palonosetron (ALOXI) injection 0.25 mg  0.25 mg Intravenous Once Jeralyn Ruths, MD        OBJECTIVE: Vitals:   08/01/22 0846  BP: 133/67  Pulse: 81  Resp: 16  Temp: 98.3 F (36.8 C)  SpO2: 100%     Body mass index is 18.99 kg/m.    ECOG FS:0 - Asymptomatic  General: Well-developed, well-nourished, no acute distress. Eyes: Pink conjunctiva, anicteric sclera. HEENT: Normocephalic, moist mucous membranes.  Mass nearly resolved. Lungs: No audible wheezing or coughing. Heart: Regular rate and rhythm. Abdomen: Soft, nontender, no obvious distention. Musculoskeletal: No edema, cyanosis, or clubbing. Neuro: Alert, answering all questions appropriately. Cranial nerves grossly intact. Skin: No rashes or petechiae noted. Psych: Normal affect.  LAB RESULTS:  Lab Results  Component Value Date   NA 138 08/01/2022   K 4.3 08/01/2022   CL 103 08/01/2022   CO2 23 08/01/2022   GLUCOSE 111 (H) 08/01/2022   BUN 38 (H) 08/01/2022   CREATININE 0.92 08/01/2022   CALCIUM 9.2 08/01/2022   PROT 7.7 05/28/2022   ALBUMIN 4.2 05/28/2022   AST 18 05/28/2022   ALT 15 05/28/2022   ALKPHOS 69 05/28/2022   BILITOT 0.6 05/28/2022   GFRNONAA >60 08/01/2022    Lab Results  Component Value Date   WBC 2.8 (L) 08/01/2022   NEUTROABS 1.3 (L) 08/01/2022   HGB 10.7 (L) 08/01/2022   HCT 31.4 (L) 08/01/2022   MCV 94.9 08/01/2022   PLT 152 08/01/2022     STUDIES: No results found.  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  CT does not distinctly  indicate a primary lesion.  PET scan results from May 27, 2022 reviewed independently which large intensely hypermetabolic right neck mass consistent with nodal metastasis.  Patient also noted to have a right hypopharynx/piriform sinus focus which is possibly the primary lesion.  He also was noted to have indeterminate right axillary and right inguinal lymph nodes, unlikely metastasis but will monitor closely.   Patient does not require port placement, but would consider one in the future if IV access became an issue.  Continue with daily XRT completing treatments on August 12, 2022.  Proceed with cycle 6 of cisplatin today.  Return to clinic in 1 week for further evaluation and consideration of his seventh and final treatment.   Leukopenia: Chronic and unchanged, monitor. Anemia: Mild.  Patient's  hemoglobin has trended down slightly to 10.7.  Thrombocytopenia: Resolved.  Proceed with treatment as above. Neck wound: Resolved. Hyponatremia: Resolved.  Patient expressed understanding and was in agreement with this plan. He also understands that He can call clinic at any time with any questions, concerns, or complaints.    Cancer Staging  Squamous cell carcinoma of head and neck Staging form: Cervical Lymph Nodes and Unknown Primary Tumors of the Head and Neck, AJCC 8th Edition - Clinical: Stage IVA (cT0, cN2c, cM0) - Signed by Jeralyn Ruths, MD on 06/20/2022   Jeralyn Ruths, MD   08/01/2022 9:42 AM

## 2022-08-01 NOTE — Progress Notes (Signed)
Nutrition Follow-up:   Patient with stage IV squamous cell carcinoma of head and neck (right neck mass).  Receiving concurrent chemotherapy and radiation.   Met with patient during infusion.  Reports that he is mostly drinking ensure/boost plus shakes, 5-6 a day.  Having issues with thick saliva.  Denies mouth pain or pain on swallowing.  Taste alterations are also impacting ability to eat solid foods.  Reports that sometimes he will get a craving of a certain food (ie potato salad) and go and try it.      Medications: reviewed  Labs: reviewed  Anthropometrics:   Weight 128 lb 9.6 oz today  133 lb 3.2 oz on 7/11 125 lb on 6/27 131 lb on 6/20 140 lb about 1 month ago   NUTRITION DIAGNOSIS: Unintentional weight loss continues    INTERVENTION:  Recommend boost VHC for added calories and protein (530 calorie, 22 g protein/each).  Samples given Recommend at least 2 boost [facility] and 2-3 ensure plus/boost plus a day    MONITORING, EVALUATION, GOAL: weight trends, intake   NEXT VISIT: Thursday, July 25 during infusion  Kailiana Granquist B. Freida Busman, RD, LDN Registered Dietitian [phone]

## 2022-08-01 NOTE — Progress Notes (Signed)
1224Orlie Dakin, MD okay with starting pre-meds while waiting for urine output requirements.   1300: Urine requirement met.  1332: MD okay with running post fluids with treatment.

## 2022-08-02 ENCOUNTER — Other Ambulatory Visit: Payer: Self-pay | Admitting: Nurse Practitioner

## 2022-08-02 ENCOUNTER — Ambulatory Visit
Admission: RE | Admit: 2022-08-02 | Discharge: 2022-08-02 | Disposition: A | Discharge status: Home / Self Care | Payer: Medicare Other | Source: Ambulatory Visit | Attending: Radiation Oncology | Admitting: Radiation Oncology

## 2022-08-02 ENCOUNTER — Other Ambulatory Visit: Payer: Self-pay

## 2022-08-02 DIAGNOSIS — C760 Malignant neoplasm of head, face and neck: Secondary | ICD-10-CM | POA: Diagnosis not present

## 2022-08-02 LAB — RAD ONC ARIA SESSION SUMMARY
Course Elapsed Days: 39
Plan Fractions Treated to Date: 17
Plan Prescribed Dose Per Fraction: 2 Gy
Plan Total Fractions Prescribed: 23
Plan Total Prescribed Dose: 46 Gy
Reference Point Dosage Given to Date: 58 Gy
Reference Point Session Dosage Given: 2 Gy
Session Number: 29

## 2022-08-03 ENCOUNTER — Encounter: Payer: Self-pay | Admitting: Nurse Practitioner

## 2022-08-03 ENCOUNTER — Encounter: Payer: Self-pay | Admitting: Oncology

## 2022-08-05 ENCOUNTER — Ambulatory Visit
Admission: RE | Admit: 2022-08-05 | Discharge: 2022-08-05 | Disposition: A | Discharge status: Home / Self Care | Payer: Medicare Other | Source: Ambulatory Visit | Attending: Radiation Oncology | Admitting: Radiation Oncology

## 2022-08-05 ENCOUNTER — Other Ambulatory Visit: Payer: Self-pay

## 2022-08-05 DIAGNOSIS — C760 Malignant neoplasm of head, face and neck: Secondary | ICD-10-CM | POA: Diagnosis not present

## 2022-08-05 LAB — RAD ONC ARIA SESSION SUMMARY
Course Elapsed Days: 42
Plan Fractions Treated to Date: 18
Plan Prescribed Dose Per Fraction: 2 Gy
Plan Total Fractions Prescribed: 23
Plan Total Prescribed Dose: 46 Gy
Reference Point Dosage Given to Date: 60 Gy
Reference Point Session Dosage Given: 2 Gy
Session Number: 30

## 2022-08-06 ENCOUNTER — Other Ambulatory Visit: Payer: Self-pay

## 2022-08-06 ENCOUNTER — Ambulatory Visit
Admission: RE | Admit: 2022-08-06 | Discharge: 2022-08-06 | Disposition: A | Discharge status: Home / Self Care | Payer: Medicare Other | Source: Ambulatory Visit | Attending: Radiation Oncology | Admitting: Radiation Oncology

## 2022-08-06 DIAGNOSIS — C760 Malignant neoplasm of head, face and neck: Secondary | ICD-10-CM | POA: Diagnosis not present

## 2022-08-06 LAB — RAD ONC ARIA SESSION SUMMARY
Course Elapsed Days: 43
Plan Fractions Treated to Date: 19
Plan Prescribed Dose Per Fraction: 2 Gy
Plan Total Fractions Prescribed: 23
Plan Total Prescribed Dose: 46 Gy
Reference Point Dosage Given to Date: 62 Gy
Reference Point Session Dosage Given: 2 Gy
Session Number: 31

## 2022-08-07 ENCOUNTER — Ambulatory Visit
Admission: RE | Admit: 2022-08-07 | Discharge: 2022-08-07 | Disposition: A | Discharge status: Home / Self Care | Payer: Medicare Other | Source: Ambulatory Visit | Attending: Radiation Oncology | Admitting: Radiation Oncology

## 2022-08-07 ENCOUNTER — Other Ambulatory Visit: Payer: Self-pay

## 2022-08-07 DIAGNOSIS — C760 Malignant neoplasm of head, face and neck: Secondary | ICD-10-CM | POA: Diagnosis not present

## 2022-08-07 LAB — RAD ONC ARIA SESSION SUMMARY
Course Elapsed Days: 44
Plan Fractions Treated to Date: 20
Plan Prescribed Dose Per Fraction: 2 Gy
Plan Total Fractions Prescribed: 23
Plan Total Prescribed Dose: 46 Gy
Reference Point Dosage Given to Date: 64 Gy
Reference Point Session Dosage Given: 2 Gy
Session Number: 32

## 2022-08-07 MED FILL — Fosaprepitant Dimeglumine For IV Infusion 150 MG (Base Eq): INTRAVENOUS | Qty: 5 | Status: AC

## 2022-08-07 MED FILL — Dexamethasone Sodium Phosphate Inj 100 MG/10ML: INTRAMUSCULAR | Qty: 1 | Status: AC

## 2022-08-08 ENCOUNTER — Inpatient Hospital Stay: Payer: Medicare Other

## 2022-08-08 ENCOUNTER — Other Ambulatory Visit: Payer: Self-pay

## 2022-08-08 ENCOUNTER — Ambulatory Visit
Admission: RE | Admit: 2022-08-08 | Discharge: 2022-08-08 | Disposition: A | Discharge status: Home / Self Care | Payer: Medicare Other | Source: Ambulatory Visit | Attending: Radiation Oncology | Admitting: Radiation Oncology

## 2022-08-08 ENCOUNTER — Inpatient Hospital Stay: Payer: Medicare Other | Admitting: Oncology

## 2022-08-08 ENCOUNTER — Encounter: Payer: Self-pay | Admitting: Oncology

## 2022-08-08 DIAGNOSIS — C760 Malignant neoplasm of head, face and neck: Secondary | ICD-10-CM | POA: Diagnosis not present

## 2022-08-08 DIAGNOSIS — C4442 Squamous cell carcinoma of skin of scalp and neck: Secondary | ICD-10-CM | POA: Diagnosis not present

## 2022-08-08 LAB — CBC WITH DIFFERENTIAL (CANCER CENTER ONLY)
Abs Immature Granulocytes: 0.13 10*3/uL — ABNORMAL HIGH (ref 0.00–0.07)
Basophils Absolute: 0 10*3/uL (ref 0.0–0.1)
Basophils Relative: 1 %
Eosinophils Absolute: 0.1 10*3/uL (ref 0.0–0.5)
Eosinophils Relative: 2 %
HCT: 32.8 % — ABNORMAL LOW (ref 39.0–52.0)
Hemoglobin: 10.7 g/dL — ABNORMAL LOW (ref 13.0–17.0)
Immature Granulocytes: 3 %
Lymphocytes Relative: 17 %
Lymphs Abs: 0.7 10*3/uL (ref 0.7–4.0)
MCH: 31.8 pg (ref 26.0–34.0)
MCHC: 32.6 g/dL (ref 30.0–36.0)
MCV: 97.3 fL (ref 80.0–100.0)
Monocytes Absolute: 1.1 10*3/uL — ABNORMAL HIGH (ref 0.1–1.0)
Monocytes Relative: 29 %
Neutro Abs: 1.9 10*3/uL (ref 1.7–7.7)
Neutrophils Relative %: 48 %
Platelet Count: 196 10*3/uL (ref 150–400)
RBC: 3.37 MIL/uL — ABNORMAL LOW (ref 4.22–5.81)
RDW: 15.5 % (ref 11.5–15.5)
WBC Count: 3.9 10*3/uL — ABNORMAL LOW (ref 4.0–10.5)
nRBC: 0 % (ref 0.0–0.2)

## 2022-08-08 LAB — RAD ONC ARIA SESSION SUMMARY
Course Elapsed Days: 45
Plan Fractions Treated to Date: 21
Plan Prescribed Dose Per Fraction: 2 Gy
Plan Total Fractions Prescribed: 23
Plan Total Prescribed Dose: 46 Gy
Reference Point Dosage Given to Date: 66 Gy
Reference Point Session Dosage Given: 2 Gy
Session Number: 33

## 2022-08-08 LAB — BASIC METABOLIC PANEL - CANCER CENTER ONLY
Anion gap: 9 (ref 5–15)
BUN: 51 mg/dL — ABNORMAL HIGH (ref 8–23)
CO2: 28 mmol/L (ref 22–32)
Calcium: 9.6 mg/dL (ref 8.9–10.3)
Chloride: 102 mmol/L (ref 98–111)
Creatinine: 0.97 mg/dL (ref 0.61–1.24)
GFR, Estimated: >60 mL/min (ref >60)
Glucose, Bld: 202 mg/dL — ABNORMAL HIGH (ref 70–99)
Potassium: 4 mmol/L (ref 3.5–5.1)
Sodium: 139 mmol/L (ref 135–145)

## 2022-08-08 LAB — MAGNESIUM: Magnesium: 2.3 mg/dL (ref 1.7–2.4)

## 2022-08-08 MED ORDER — PALONOSETRON HCL INJECTION 0.25 MG/5ML
0.2500 mg | Freq: Once | INTRAVENOUS | Status: AC
  Administered 2022-08-08: 0.25 mg via INTRAVENOUS
  Filled 2022-08-08: qty 5

## 2022-08-08 MED ORDER — MAGNESIUM SULFATE 2 GM/50ML IV SOLN
2.0000 g | Freq: Once | INTRAVENOUS | Status: AC
  Administered 2022-08-08: 2 g via INTRAVENOUS
  Filled 2022-08-08: qty 50

## 2022-08-08 MED ORDER — POTASSIUM CHLORIDE IN NACL 20-0.9 MEQ/L-% IV SOLN
Freq: Once | INTRAVENOUS | Status: AC
  Filled 2022-08-08: qty 1000

## 2022-08-08 MED ORDER — SODIUM CHLORIDE 0.9 % IV SOLN
Freq: Once | INTRAVENOUS | Status: AC
  Filled 2022-08-08: qty 250

## 2022-08-08 MED ORDER — SODIUM CHLORIDE 0.9 % IV SOLN
150.0000 mg | Freq: Once | INTRAVENOUS | Status: AC
  Administered 2022-08-08: 150 mg via INTRAVENOUS
  Filled 2022-08-08: qty 150

## 2022-08-08 MED ORDER — SODIUM CHLORIDE 0.9 % IV SOLN
10.0000 mg | Freq: Once | INTRAVENOUS | Status: AC
  Administered 2022-08-08: 10 mg via INTRAVENOUS
  Filled 2022-08-08: qty 10

## 2022-08-08 MED ORDER — SODIUM CHLORIDE 0.9 % IV SOLN
40.0000 mg/m2 | Freq: Once | INTRAVENOUS | Status: AC
  Administered 2022-08-08: 68 mg via INTRAVENOUS
  Filled 2022-08-08: qty 68

## 2022-08-08 NOTE — Progress Notes (Signed)
Nutrition Follow-up:   Patient with stage IV squamous cell carcinoma of head and neck (right neck mass).  Receiving concurrent chemotherapy and radiation. Last chemo today and last radiation planned for 7/29.    Met with patient during infusion.  Reports that yesterday was able to drink 2 boost VHC shakes and 2, 350 calorie shakes.  Also drinking water.  Not eating much solid foods due to taste alterations and queasy feeling.  Has taken nausea medications, more at night than during the day.     Medications: reviewed  Labs: glucose 202, BUN 51, creatinine 0.97  Anthropometrics:   Weight 127 lb today  128 lb 9.6 oz on 7/18 133 lb on 7/11 125 lb on 6/27 131 lb on 6/20   NUTRITION DIAGNOSIS: Unintentional weight loss continues   INTERVENTION:  Continue Boost VHC and ensure/boost plus shakes at least 4 per day or more if possible Encouraged taking nausea medication to help control symptoms.    MONITORING, EVALUATION, GOAL: weight trends, intake   NEXT VISIT: phone call on Friday, August 9  Dakwan Pridgen B. Freida Busman, RD, LDN Registered Dietitian [phone]

## 2022-08-08 NOTE — Patient Instructions (Signed)
Gary Wilkins  Discharge Instructions: Thank you for choosing [facility] to provide your oncology and hematology care.  If you have a lab appointment with the [facility], please go directly to the [facility] and check in at the registration area.  Wear comfortable clothing and clothing appropriate for easy access to any Portacath or PICC line.   We strive to give you quality time with your provider. You may need to reschedule your appointment if you arrive late (15 or more minutes).  Arriving late affects you and other patients whose appointments are after yours.  Also, if you miss three or more appointments without notifying the office, you may be dismissed from the clinic at the provider's discretion.      For prescription refill requests, have your pharmacy contact our office and allow 72 hours for refills to be completed.    Today you received the following chemotherapy and/or immunotherapy agents Cisplatin      To help prevent nausea and vomiting after your treatment, we encourage you to take your nausea medication as directed.  BELOW ARE SYMPTOMS THAT SHOULD BE REPORTED IMMEDIATELY: *FEVER GREATER THAN 100.4 F (38 C) OR HIGHER *CHILLS OR SWEATING *NAUSEA AND VOMITING THAT IS NOT CONTROLLED WITH YOUR NAUSEA MEDICATION *UNUSUAL SHORTNESS OF BREATH *UNUSUAL BRUISING OR BLEEDING *URINARY PROBLEMS (pain or burning when urinating, or frequent urination) *BOWEL PROBLEMS (unusual diarrhea, constipation, pain near the anus) TENDERNESS IN MOUTH AND THROAT WITH OR WITHOUT PRESENCE OF ULCERS (sore throat, sores in mouth, or a toothache) UNUSUAL RASH, SWELLING OR PAIN  UNUSUAL VAGINAL DISCHARGE OR ITCHING   Items with * indicate a potential emergency and should be followed up as soon as possible or go to the Emergency Department if any problems should occur.  Please show the CHEMOTHERAPY ALERT CARD or IMMUNOTHERAPY ALERT CARD at check-in to  the Emergency Department and triage nurse.  Should you have questions after your visit or need to cancel or reschedule your appointment, please contact [facility]  [phone] and follow the prompts.  Office hours are 8:00 a.m. to 4:30 p.m. Monday - Friday. Please note that voicemails left after 4:00 p.m. may not be returned until the following business day.  We are closed weekends and major holidays. You have access to a nurse at all times for urgent questions. Please call the main number to the clinic [phone] and follow the prompts.  For any non-urgent questions, you may also contact your provider using MyChart. We now offer e-Visits for anyone 28 and older to request care online for non-urgent symptoms. For details visit [email or web address].   Also download the MyChart app! Go to the app store, search "MyChart", open the app, select [facility], and log in with your MyChart username and password.

## 2022-08-08 NOTE — Progress Notes (Signed)
[facility] CSW Progress Note  Visual merchandiser met with patient while he was in infusion to assess needs.  Patient stated he only had a few treatments left and was looking forward to that.  He acknowledged working with the dietician earlier today regarding his intake.  He displayed a bright affect at times and appeared to respond positively to discussing current events in his life.  He expressed no other needs at this time.    Dorothey Baseman, LCSW Clinical Social Worker [facility]

## 2022-08-08 NOTE — Progress Notes (Signed)
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  INTERVAL HISTORY: Patient returns to clinic today for further evaluation and consideration of his seventh and final treatment of weekly cisplatin.  He continues to have dysphagia and dry mouth, but otherwise feels well and is tolerating his treatments without significant side effects.  The mass on the right side of his neck is nearly resolved.  He has no neurologic complaints.  He denies any recent fevers or illnesses.  He has no chest pain, shortness of breath, cough, or hemoptysis.  He denies any nausea, vomiting, constipation, or diarrhea.  He has no urinary complaints.  Patient offers no further specific complaints today.  REVIEW OF SYSTEMS:   Review of Systems  Constitutional: Negative.  Negative for fever, malaise/fatigue and weight loss.  HENT:  Negative for sore throat.   Respiratory: Negative.  Negative for cough, hemoptysis and shortness of breath.   Cardiovascular: Negative.  Negative for chest pain and leg swelling.  Gastrointestinal: Negative.  Negative for abdominal pain.  Genitourinary: Negative.  Negative for dysuria.  Musculoskeletal: Negative.  Negative for back pain.  Skin: Negative.  Negative for rash.  Neurological: Negative.  Negative for dizziness, focal weakness, weakness and headaches.  Psychiatric/Behavioral: Negative.  The patient is not nervous/anxious.     As per HPI. Otherwise, a complete review of systems is negative.  PAST MEDICAL HISTORY: History reviewed. No pertinent past medical history.  PAST SURGICAL HISTORY: Past Surgical History:  Procedure Laterality Date   APPENDECTOMY  06/22/20   JOINT REPLACEMENT  02/26/2003   hip   LAPAROSCOPIC APPENDECTOMY N/A 06/23/2020   Procedure: APPENDECTOMY  LAPAROSCOPIC;  Surgeon: Duanne Guess, MD;  Location: [facility] ORS;  Service: General;  Laterality: N/A;   TOTAL HIP ARTHROPLASTY Right     FAMILY HISTORY: Family History  Problem Relation Age of Onset   Alzheimer's disease Mother    Stroke Father     ADVANCED DIRECTIVES (Y/N):  N  HEALTH MAINTENANCE: Social History   Tobacco Use   Smoking status: Every Day    Current packs/day: 0.50    Types: Cigarettes   Smokeless tobacco: Never  Vaping Use   Vaping status: Never Used  Substance Use Topics   Alcohol use: Yes    Alcohol/week: 5.0 standard drinks of alcohol    Types: 5 Shots of liquor per week   Drug use: Not Currently     Colonoscopy:  PAP:  Bone density:  Lipid panel:  No Known Allergies  Current Outpatient Medications  Medication Sig Dispense Refill   ondansetron (ZOFRAN) 8 MG tablet Take 1 tablet (8 mg total) by mouth every 8 (eight) hours as needed for nausea or vomiting. Start on the third day after cisplatin. 60 tablet 1   polyethylene glycol (MIRALAX / GLYCOLAX) 17 g packet Take 17 g by mouth daily.     prochlorperazine (COMPAZINE) 10 MG tablet Take 1 tablet (10 mg total) by mouth every 6 (six) hours as needed (Nausea or vomiting). 60 tablet 1   tamsulosin (FLOMAX) 0.4 MG CAPS capsule TAKE 1 CAP DAILY AFTER SUPPER FOR PROSTATE. IF NO RESPONSE IN 2-4 WKS, NOTIFY MD FOR DOSE CHANGE. 90 capsule 1   No current facility-administered medications for this visit.    OBJECTIVE: Vitals:   08/08/22 0850  BP: 120/64  Pulse: 83  Resp: 16  Temp: (!) 96 F (35.6 C)  SpO2: 100%     Body mass index is 18.75 kg/m.    ECOG FS:0 - Asymptomatic  General: Well-developed, well-nourished, no acute distress. Eyes: Pink conjunctiva, anicteric sclera. HEENT: Normocephalic, moist mucous membranes.  Mass on right neck essentially resolved. Lungs: No audible wheezing or coughing. Heart: Regular rate and rhythm. Abdomen: Soft, nontender, no obvious  distention. Musculoskeletal: No edema, cyanosis, or clubbing. Neuro: Alert, answering all questions appropriately. Cranial nerves grossly intact. Skin: No rashes or petechiae noted. Psych: Normal affect.  LAB RESULTS:  Lab Results  Component Value Date   NA 139 08/08/2022   K 4.0 08/08/2022   CL 102 08/08/2022   CO2 28 08/08/2022   GLUCOSE 202 (H) 08/08/2022   BUN 51 (H) 08/08/2022   CREATININE 0.97 08/08/2022   CALCIUM 9.6 08/08/2022   PROT 7.7 05/28/2022   ALBUMIN 4.2 05/28/2022   AST 18 05/28/2022   ALT 15 05/28/2022   ALKPHOS 69 05/28/2022   BILITOT 0.6 05/28/2022   GFRNONAA >60 08/08/2022    Lab Results  Component Value Date   WBC 3.9 (L) 08/08/2022   NEUTROABS 1.9 08/08/2022   HGB 10.7 (L) 08/08/2022   HCT 32.8 (L) 08/08/2022   MCV 97.3 08/08/2022   PLT 196 08/08/2022     STUDIES: No results found.  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  CT does not distinctly indicate a primary lesion.  PET scan results from May 27, 2022 reviewed independently which large intensely hypermetabolic right neck mass consistent with nodal metastasis.  Patient also noted to have a right hypopharynx/piriform sinus focus which is possibly the primary lesion.  He also was noted to have indeterminate right axillary and right inguinal lymph nodes, unlikely metastasis but will monitor closely.    Continue with daily XRT completing treatments on August 12, 2022.  Proceed with cycle 7 of cisplatin today.  Return to clinic in 1 month for repeat laboratory work and further evaluation.  Will repeat PET scan in mid October.   Leukopenia: Mildly improved.   Anemia: Chronic and unchanged.  Patient's hemoglobin is 10.7. Thrombocytopenia: Resolved.   Patient expressed understanding and was in agreement with this plan. He also  understands that He can call clinic at any time with any questions, concerns, or complaints.    Cancer Staging  Squamous cell carcinoma of head and neck Staging form: Cervical Lymph Nodes and Unknown Primary Tumors of the Head and Neck, AJCC 8th Edition - Clinical: Stage IVA (cT0, cN2c, cM0) - Signed by Jeralyn Ruths, MD on 06/20/2022   Jeralyn Ruths, MD   08/08/2022 9:04 AM

## 2022-08-08 NOTE — Progress Notes (Signed)
Urine output=70mL. Per MD, ok to proceed with cisplatin and to resume post-hydration fluids with cisplatin.

## 2022-08-09 ENCOUNTER — Other Ambulatory Visit: Payer: Self-pay

## 2022-08-09 ENCOUNTER — Ambulatory Visit
Admission: RE | Admit: 2022-08-09 | Discharge: 2022-08-09 | Disposition: A | Discharge status: Home / Self Care | Payer: Medicare Other | Source: Ambulatory Visit | Attending: Radiation Oncology | Admitting: Radiation Oncology

## 2022-08-09 DIAGNOSIS — C760 Malignant neoplasm of head, face and neck: Secondary | ICD-10-CM | POA: Diagnosis not present

## 2022-08-09 LAB — RAD ONC ARIA SESSION SUMMARY
Course Elapsed Days: 46
Plan Fractions Treated to Date: 22
Plan Prescribed Dose Per Fraction: 2 Gy
Plan Total Fractions Prescribed: 23
Plan Total Prescribed Dose: 46 Gy
Reference Point Dosage Given to Date: 68 Gy
Reference Point Session Dosage Given: 2 Gy
Session Number: 34

## 2022-08-12 ENCOUNTER — Ambulatory Visit
Admission: RE | Admit: 2022-08-12 | Discharge: 2022-08-12 | Disposition: A | Discharge status: Home / Self Care | Payer: Medicare Other | Source: Ambulatory Visit | Attending: Radiation Oncology | Admitting: Radiation Oncology

## 2022-08-12 ENCOUNTER — Other Ambulatory Visit: Payer: Self-pay

## 2022-08-12 DIAGNOSIS — C760 Malignant neoplasm of head, face and neck: Secondary | ICD-10-CM | POA: Diagnosis not present

## 2022-08-12 LAB — RAD ONC ARIA SESSION SUMMARY
Course Elapsed Days: 49
Plan Fractions Treated to Date: 23
Plan Prescribed Dose Per Fraction: 2 Gy
Plan Total Fractions Prescribed: 23
Plan Total Prescribed Dose: 46 Gy
Reference Point Dosage Given to Date: 70 Gy
Reference Point Session Dosage Given: 2 Gy
Session Number: 35

## 2022-08-23 ENCOUNTER — Other Ambulatory Visit: Payer: Self-pay

## 2022-08-23 ENCOUNTER — Inpatient Hospital Stay: Payer: Medicare Other | Attending: Oncology

## 2022-08-23 DIAGNOSIS — Z9221 Personal history of antineoplastic chemotherapy: Secondary | ICD-10-CM | POA: Insufficient documentation

## 2022-08-23 DIAGNOSIS — D649 Anemia, unspecified: Secondary | ICD-10-CM | POA: Insufficient documentation

## 2022-08-23 DIAGNOSIS — F17210 Nicotine dependence, cigarettes, uncomplicated: Secondary | ICD-10-CM | POA: Insufficient documentation

## 2022-08-23 DIAGNOSIS — Z8522 Personal history of malignant neoplasm of nasal cavities, middle ear, and accessory sinuses: Secondary | ICD-10-CM | POA: Insufficient documentation

## 2022-08-23 DIAGNOSIS — Z923 Personal history of irradiation: Secondary | ICD-10-CM | POA: Insufficient documentation

## 2022-08-23 NOTE — Progress Notes (Signed)
error 

## 2022-08-23 NOTE — Progress Notes (Signed)
Nutrition Follow-up:  Patient with stage IV squamous cell carcinoma of head and neck (right neck mass).  Completed concurrent chemotherapy (7/25) and radiation (7/29).    Spoke with patient via phone for follow-up.  Patient reports that he is having low grade nausea, helped by taking pepto bismol.  Continuing to rely mostly on ensure/boost shakes. Says that he is getting mostly 1700 calories a day.  Is trying solid foods (ie mashed potatoes, fruits, ice cream).  Also having issues with thick saliva.      Medications: reviewed  Labs: reviewed  Anthropometrics:   Weight 127 lb 128 lb 9.6 oz on 7/18 133 lb on 7/11 125 lb on 6/27 131 lb on 6/20   NUTRITION DIAGNOSIS: Unintentional weight loss continues   INTERVENTION:  Continue boost VHC and ensure/boost shakes at least 4 per day or more  Encouraged taking prescribed nausea medication if pepto bismol not working Contact information provided and patient will contact RD if needed in the future    MONITORING, EVALUATION, GOAL: weight trends, intake   NEXT VISIT: as needed Patient has RD contact information   B. Freida Busman, RD, LDN Registered Dietitian [phone]

## 2022-09-06 ENCOUNTER — Other Ambulatory Visit: Payer: Self-pay

## 2022-09-06 DIAGNOSIS — C4442 Squamous cell carcinoma of skin of scalp and neck: Secondary | ICD-10-CM

## 2022-09-06 DIAGNOSIS — Z5111 Encounter for antineoplastic chemotherapy: Secondary | ICD-10-CM

## 2022-09-09 ENCOUNTER — Encounter: Payer: Self-pay | Admitting: Oncology

## 2022-09-09 ENCOUNTER — Inpatient Hospital Stay: Payer: Medicare Other

## 2022-09-09 ENCOUNTER — Inpatient Hospital Stay (HOSPITAL_BASED_OUTPATIENT_CLINIC_OR_DEPARTMENT_OTHER): Payer: Medicare Other | Admitting: Oncology

## 2022-09-09 ENCOUNTER — Telehealth: Payer: Self-pay

## 2022-09-09 VITALS — BP 133/59 | HR 80 | Temp 96.5°F | Resp 18 | Ht 69.0 in | Wt 123.1 lb

## 2022-09-09 DIAGNOSIS — C4442 Squamous cell carcinoma of skin of scalp and neck: Secondary | ICD-10-CM

## 2022-09-09 DIAGNOSIS — Z923 Personal history of irradiation: Secondary | ICD-10-CM | POA: Diagnosis not present

## 2022-09-09 DIAGNOSIS — Z8522 Personal history of malignant neoplasm of nasal cavities, middle ear, and accessory sinuses: Secondary | ICD-10-CM | POA: Diagnosis present

## 2022-09-09 DIAGNOSIS — D649 Anemia, unspecified: Secondary | ICD-10-CM | POA: Diagnosis not present

## 2022-09-09 DIAGNOSIS — Z5111 Encounter for antineoplastic chemotherapy: Secondary | ICD-10-CM

## 2022-09-09 DIAGNOSIS — F17210 Nicotine dependence, cigarettes, uncomplicated: Secondary | ICD-10-CM | POA: Diagnosis not present

## 2022-09-09 DIAGNOSIS — Z9221 Personal history of antineoplastic chemotherapy: Secondary | ICD-10-CM | POA: Diagnosis not present

## 2022-09-09 LAB — CMP (CANCER CENTER ONLY)
ALT: 19 U/L (ref 0–44)
AST: 18 U/L (ref 15–41)
Albumin: 3.8 g/dL (ref 3.5–5.0)
Alkaline Phosphatase: 58 U/L (ref 38–126)
Anion gap: 6 (ref 5–15)
BUN: 45 mg/dL — ABNORMAL HIGH (ref 8–23)
CO2: 27 mmol/L (ref 22–32)
Calcium: 9.1 mg/dL (ref 8.9–10.3)
Chloride: 103 mmol/L (ref 98–111)
Creatinine: 0.92 mg/dL (ref 0.61–1.24)
GFR, Estimated: >60 mL/min (ref >60)
Glucose, Bld: 144 mg/dL — ABNORMAL HIGH (ref 70–99)
Potassium: 4 mmol/L (ref 3.5–5.1)
Sodium: 136 mmol/L (ref 135–145)
Total Bilirubin: 0.3 mg/dL (ref 0.3–1.2)
Total Protein: 6.8 g/dL (ref 6.5–8.1)

## 2022-09-09 LAB — CBC WITH DIFFERENTIAL (CANCER CENTER ONLY)
Abs Immature Granulocytes: 0.05 10*3/uL (ref 0.00–0.07)
Basophils Absolute: 0.1 10*3/uL (ref 0.0–0.1)
Basophils Relative: 1 %
Eosinophils Absolute: 0.2 10*3/uL (ref 0.0–0.5)
Eosinophils Relative: 4 %
HCT: 30.7 % — ABNORMAL LOW (ref 39.0–52.0)
Hemoglobin: 10 g/dL — ABNORMAL LOW (ref 13.0–17.0)
Immature Granulocytes: 1 %
Lymphocytes Relative: 19 %
Lymphs Abs: 0.9 10*3/uL (ref 0.7–4.0)
MCH: 33.2 pg (ref 26.0–34.0)
MCHC: 32.6 g/dL (ref 30.0–36.0)
MCV: 102 fL — ABNORMAL HIGH (ref 80.0–100.0)
Monocytes Absolute: 0.9 10*3/uL (ref 0.1–1.0)
Monocytes Relative: 19 %
Neutro Abs: 2.6 10*3/uL (ref 1.7–7.7)
Neutrophils Relative %: 56 %
Platelet Count: 230 10*3/uL (ref 150–400)
RBC: 3.01 MIL/uL — ABNORMAL LOW (ref 4.22–5.81)
RDW: 17 % — ABNORMAL HIGH (ref 11.5–15.5)
WBC Count: 4.7 10*3/uL (ref 4.0–10.5)
nRBC: 0 % (ref 0.0–0.2)

## 2022-09-09 LAB — MAGNESIUM: Magnesium: 2.3 mg/dL (ref 1.7–2.4)

## 2022-09-09 NOTE — Progress Notes (Signed)
New pain located nearby tumor, and side effects have been persistent.

## 2022-09-09 NOTE — Progress Notes (Signed)
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  INTERVAL HISTORY: Patient returns to clinic today for repeat laboratory work in 1 month follow-up after completing his concurrent cisplatin and XRT.  He continues to have some mild neck tenderness and dry mouth, but otherwise feels well.  He does not complain of dysphagia today but states he has not transitioned back to solid food.  He has no neurologic complaints.  He denies any recent fevers or illnesses.  He has no chest pain, shortness of breath, cough, or hemoptysis.  He denies any nausea, vomiting, constipation, or diarrhea.  He has no urinary complaints.  Patient offers no further specific complaints today.  REVIEW OF SYSTEMS:   Review of Systems  Constitutional: Negative.  Negative for fever, malaise/fatigue and weight loss.  HENT:  Negative for sore throat.   Respiratory: Negative.  Negative for cough, hemoptysis and shortness of breath.   Cardiovascular: Negative.  Negative for chest pain and leg swelling.  Gastrointestinal: Negative.  Negative for abdominal pain.  Genitourinary: Negative.  Negative for dysuria.  Musculoskeletal: Negative.  Negative for back pain.  Skin: Negative.  Negative for rash.  Neurological: Negative.  Negative for dizziness, focal weakness, weakness and headaches.  Psychiatric/Behavioral: Negative.  The patient is not nervous/anxious.     As per HPI. Otherwise, a complete review of systems is negative.  PAST MEDICAL HISTORY: History reviewed. No pertinent past medical history.  PAST SURGICAL HISTORY: Past Surgical History:  Procedure Laterality Date   APPENDECTOMY  06/22/20   JOINT REPLACEMENT  02/26/2003   hip   LAPAROSCOPIC APPENDECTOMY N/A 06/23/2020   Procedure: APPENDECTOMY LAPAROSCOPIC;   Surgeon: Duanne Guess, MD;  Location: [facility] ORS;  Service: General;  Laterality: N/A;   TOTAL HIP ARTHROPLASTY Right     FAMILY HISTORY: Family History  Problem Relation Age of Onset   Alzheimer's disease Mother    Stroke Father     ADVANCED DIRECTIVES (Y/N):  N  HEALTH MAINTENANCE: Social History   Tobacco Use   Smoking status: Every Day    Current packs/day: 0.50    Types: Cigarettes   Smokeless tobacco: Never  Vaping Use   Vaping status: Never Used  Substance Use Topics   Alcohol use: Yes    Alcohol/week: 5.0 standard drinks of alcohol    Types: 5 Shots of liquor per week   Drug use: Not Currently     Colonoscopy:  PAP:  Bone density:  Lipid panel:  No Known Allergies  Current Outpatient Medications  Medication Sig Dispense Refill   ondansetron (ZOFRAN) 8 MG tablet Take 1 tablet (8 mg total) by mouth every 8 (eight) hours as needed for nausea or vomiting. Start on the third day after cisplatin. 60 tablet 1   polyethylene glycol (MIRALAX / GLYCOLAX) 17 g packet Take 17 g by mouth daily.     prochlorperazine (COMPAZINE) 10 MG tablet Take 1 tablet (10 mg total) by mouth every 6 (six) hours as needed (Nausea or vomiting). 60 tablet 1   tamsulosin (FLOMAX) 0.4 MG CAPS capsule TAKE 1 CAP DAILY AFTER SUPPER FOR PROSTATE. IF NO RESPONSE IN 2-4 WKS, NOTIFY MD FOR DOSE CHANGE. 90 capsule 1   No current facility-administered medications for this visit.    OBJECTIVE: Vitals:   09/09/22 1052  BP: (!) 133/59  Pulse: 80  Resp: 18  Temp: (!) 96.5 F (35.8 C)  SpO2: 96%      Body mass index is 18.18 kg/m.    ECOG FS:0 - Asymptomatic  General: Well-developed, well-nourished, no acute distress. Eyes: Pink conjunctiva, anicteric sclera. HEENT: Normocephalic, moist mucous membranes. Lungs: No audible wheezing or coughing. Heart: Regular rate and rhythm. Abdomen: Soft, nontender, no obvious distention. Musculoskeletal: No edema, cyanosis, or clubbing. Neuro: Alert,  answering all questions appropriately. Cranial nerves grossly intact. Skin: No rashes or petechiae noted. Psych: Normal affect.  LAB RESULTS:  Lab Results  Component Value Date   NA 136 09/09/2022   K 4.0 09/09/2022   CL 103 09/09/2022   CO2 27 09/09/2022   GLUCOSE 144 (H) 09/09/2022   BUN 45 (H) 09/09/2022   CREATININE 0.92 09/09/2022   CALCIUM 9.1 09/09/2022   PROT 6.8 09/09/2022   ALBUMIN 3.8 09/09/2022   AST 18 09/09/2022   ALT 19 09/09/2022   ALKPHOS 58 09/09/2022   BILITOT 0.3 09/09/2022   GFRNONAA >60 09/09/2022    Lab Results  Component Value Date   WBC 4.7 09/09/2022   NEUTROABS 2.6 09/09/2022   HGB 10.0 (L) 09/09/2022   HCT 30.7 (L) 09/09/2022   MCV 102.0 (H) 09/09/2022   PLT 230 09/09/2022     STUDIES: No results found.  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  CT does not distinctly indicate a primary lesion.  PET scan results from May 27, 2022 reviewed independently which large intensely hypermetabolic right neck mass consistent with nodal metastasis.  Patient also noted to have a right hypopharynx/piriform sinus focus which is possibly the primary lesion.  He also was noted to have indeterminate right axillary and right inguinal lymph nodes, unlikely metastasis but will monitor closely.  Patient completed his cisplatin and XRT therapy on August 12, 2022.  A referral has been sent back to ENT.  Return to clinic in 2 months with repeat PET scan, laboratory work, and further evaluation. Leukopenia: Resolved. Anemia: Chronic and unchanged.  Patient's hemoglobin is 10.0 today.   Patient expressed understanding and was in agreement with this plan. He also understands that He can call clinic at any time with any questions, concerns, or complaints.    Cancer Staging  Squamous cell carcinoma  of head and neck Staging form: Cervical Lymph Nodes and Unknown Primary Tumors of the Head and Neck, AJCC 8th Edition - Clinical: Stage IVA (cT0, cN2c, cM0) - Signed by Jeralyn Ruths, MD on 06/20/2022   Jeralyn Ruths, MD   09/09/2022 2:01 PM

## 2022-09-09 NOTE — Telephone Encounter (Signed)
Called to schedule visit f/u with Dr. Jenne Campus per Dr. Orlie Dakin request. Office will contact patient. New referral request faxed per request

## 2022-09-10 ENCOUNTER — Other Ambulatory Visit: Payer: Self-pay

## 2022-09-12 ENCOUNTER — Encounter: Payer: Self-pay | Admitting: Radiation Oncology

## 2022-09-12 ENCOUNTER — Ambulatory Visit
Admission: RE | Admit: 2022-09-12 | Discharge: 2022-09-12 | Disposition: A | Discharge status: Home / Self Care | Payer: Medicare Other | Source: Ambulatory Visit | Attending: Radiation Oncology | Admitting: Radiation Oncology

## 2022-09-12 VITALS — BP 130/70 | HR 81 | Temp 97.0°F | Resp 16 | Wt 134.0 lb

## 2022-09-12 DIAGNOSIS — C760 Malignant neoplasm of head, face and neck: Secondary | ICD-10-CM | POA: Insufficient documentation

## 2022-09-12 NOTE — Progress Notes (Signed)
Radiation Oncology Follow up Note  Name: Gary Wilkins   Date:   09/12/2022 MRN:  [id number] DOB: Sep 01, 1950    This 72 y.o. male presents to the clinic today for 1 month follow-up status post concurrent chemoradiation therapy for stage IV a squamous cell carcinoma of the head and neck  REFERRING PROVIDER: Jeralyn Ruths, MD  HPI: Patient is a 72 year old male now out 1 month having completed concurrent chemoradiation therapy for stage IV.  Squamous cell carcinoma the head and neck.  He presented with a rapidly increasing right neck mass CT PET CT scan showed large hypermetabolic superficial right neck mass consistent with primary head and neck cancer with hypermetabolic level 2 lymph nodes.  He is seen today 1 month out he is doing fairly well still states his taste is off which we expect.  Is having no significant dysphagia or head and neck pain at this time.  He has been asked to return to Dr. Jenne Campus for follow-up.  PET scan has been ordered  COMPLICATIONS OF TREATMENT: none  FOLLOW UP COMPLIANCE: keeps appointments   PHYSICAL EXAM:  BP 130/70   Pulse 81   Temp (!) 97 F (36.1 C) (Tympanic)   Resp 16   Wt 134 lb (60.8 kg)   BMI 19.79 kg/m  Large area of disease in his right neck has completely resolved.  Oral cavity is clear teeth in a poor state of repair.  No evidence of adenopathy in the head and neck region is noted.  Well-developed well-nourished patient in NAD. HEENT reveals PERLA, EOMI, discs not visualized.  Oral cavity is clear. No oral mucosal lesions are identified. Neck is clear without evidence of cervical or supraclavicular adenopathy. Lungs are clear to A&P. Cardiac examination is essentially unremarkable with regular rate and rhythm without murmur rub or thrill. Abdomen is benign with no organomegaly or masses noted. Motor sensory and DTR levels are equal and symmetric in the upper and lower extremities. Cranial nerves II through XII are grossly intact. Proprioception  is intact. No peripheral adenopathy or edema is identified. No motor or sensory levels are noted. Crude visual fields are within normal range.  RADIOLOGY RESULTS: PET scan ordered in 2 months  PLAN: Present time patient is doing well now out 1 month from concurrent chemoradiation therapy for locally [place] head and neck cancer.  I am pleased with his overall progress.  Of asked to see him back in 3 to 4 months for follow-up.  Dr. Orlie Dakin is already ordered a PET scan.  He will return for to ENT for follow-up and close follow-up care.  I would like to take this opportunity to thank you for allowing me to participate in the care of your patient.Carmina Miller, MD

## 2022-09-13 ENCOUNTER — Other Ambulatory Visit: Payer: Self-pay

## 2022-11-07 ENCOUNTER — Other Ambulatory Visit: Payer: Self-pay | Admitting: *Deleted

## 2022-11-07 DIAGNOSIS — C4442 Squamous cell carcinoma of skin of scalp and neck: Secondary | ICD-10-CM

## 2022-11-08 ENCOUNTER — Other Ambulatory Visit: Payer: Commercial Managed Care - PPO

## 2022-11-08 ENCOUNTER — Ambulatory Visit
Admission: RE | Admit: 2022-11-08 | Discharge: 2022-11-08 | Disposition: A | Discharge status: Home / Self Care | Payer: Medicare Other | Source: Ambulatory Visit | Attending: Oncology | Admitting: Oncology

## 2022-11-08 ENCOUNTER — Inpatient Hospital Stay: Payer: Medicare Other | Attending: Oncology

## 2022-11-08 DIAGNOSIS — Z08 Encounter for follow-up examination after completed treatment for malignant neoplasm: Secondary | ICD-10-CM | POA: Diagnosis present

## 2022-11-08 DIAGNOSIS — Z8522 Personal history of malignant neoplasm of nasal cavities, middle ear, and accessory sinuses: Secondary | ICD-10-CM | POA: Diagnosis present

## 2022-11-08 DIAGNOSIS — C4442 Squamous cell carcinoma of skin of scalp and neck: Secondary | ICD-10-CM

## 2022-11-08 LAB — CBC WITH DIFFERENTIAL/PLATELET
Abs Immature Granulocytes: 0.04 10*3/uL (ref 0.00–0.07)
Basophils Absolute: 0.1 10*3/uL (ref 0.0–0.1)
Basophils Relative: 1 %
Eosinophils Absolute: 0.1 10*3/uL (ref 0.0–0.5)
Eosinophils Relative: 2 %
HCT: 38.2 % — ABNORMAL LOW (ref 39.0–52.0)
Hemoglobin: 12.5 g/dL — ABNORMAL LOW (ref 13.0–17.0)
Immature Granulocytes: 1 %
Lymphocytes Relative: 22 %
Lymphs Abs: 1 10*3/uL (ref 0.7–4.0)
MCH: 33.8 pg (ref 26.0–34.0)
MCHC: 32.7 g/dL (ref 30.0–36.0)
MCV: 103.2 fL — ABNORMAL HIGH (ref 80.0–100.0)
Monocytes Absolute: 0.7 10*3/uL (ref 0.1–1.0)
Monocytes Relative: 15 %
Neutro Abs: 2.6 10*3/uL (ref 1.7–7.7)
Neutrophils Relative %: 59 %
Platelets: 195 10*3/uL (ref 150–400)
RBC: 3.7 MIL/uL — ABNORMAL LOW (ref 4.22–5.81)
RDW: 12.9 % (ref 11.5–15.5)
WBC: 4.5 10*3/uL (ref 4.0–10.5)
nRBC: 0 % (ref 0.0–0.2)

## 2022-11-08 LAB — CMP (CANCER CENTER ONLY)
ALT: 19 U/L (ref 0–44)
AST: 21 U/L (ref 15–41)
Albumin: 4.2 g/dL (ref 3.5–5.0)
Alkaline Phosphatase: 79 U/L (ref 38–126)
Anion gap: 9 (ref 5–15)
BUN: 37 mg/dL — ABNORMAL HIGH (ref 8–23)
CO2: 25 mmol/L (ref 22–32)
Calcium: 9.2 mg/dL (ref 8.9–10.3)
Chloride: 102 mmol/L (ref 98–111)
Creatinine: 0.9 mg/dL (ref 0.61–1.24)
GFR, Estimated: >60 mL/min (ref >60)
Glucose, Bld: 108 mg/dL — ABNORMAL HIGH (ref 70–99)
Potassium: 3.7 mmol/L (ref 3.5–5.1)
Sodium: 136 mmol/L (ref 135–145)
Total Bilirubin: 0.4 mg/dL (ref 0.3–1.2)
Total Protein: 7.1 g/dL (ref 6.5–8.1)

## 2022-11-08 LAB — MAGNESIUM: Magnesium: 2.3 mg/dL (ref 1.7–2.4)

## 2022-11-08 LAB — GLUCOSE, CAPILLARY: Glucose-Capillary: 93 mg/dL (ref 70–99)

## 2022-11-08 MED ORDER — FLUDEOXYGLUCOSE F - 18 (FDG) INJECTION
6.9000 | Freq: Once | INTRAVENOUS | Status: AC | PRN
  Administered 2022-11-08: 7.26 via INTRAVENOUS

## 2022-11-12 ENCOUNTER — Ambulatory Visit: Payer: Commercial Managed Care - PPO | Admitting: Oncology

## 2022-11-15 ENCOUNTER — Inpatient Hospital Stay: Payer: Medicare Other | Attending: Oncology | Admitting: Oncology

## 2022-11-15 ENCOUNTER — Encounter: Payer: Self-pay | Admitting: Oncology

## 2022-11-15 VITALS — BP 128/83 | HR 77 | Temp 97.0°F | Resp 16 | Ht 69.0 in | Wt 132.0 lb

## 2022-11-15 DIAGNOSIS — N1330 Unspecified hydronephrosis: Secondary | ICD-10-CM

## 2022-11-15 DIAGNOSIS — Z08 Encounter for follow-up examination after completed treatment for malignant neoplasm: Secondary | ICD-10-CM | POA: Insufficient documentation

## 2022-11-15 DIAGNOSIS — N132 Hydronephrosis with renal and ureteral calculous obstruction: Secondary | ICD-10-CM | POA: Diagnosis not present

## 2022-11-15 DIAGNOSIS — F17210 Nicotine dependence, cigarettes, uncomplicated: Secondary | ICD-10-CM | POA: Insufficient documentation

## 2022-11-15 DIAGNOSIS — Z8522 Personal history of malignant neoplasm of nasal cavities, middle ear, and accessory sinuses: Secondary | ICD-10-CM | POA: Diagnosis present

## 2022-11-15 DIAGNOSIS — R682 Dry mouth, unspecified: Secondary | ICD-10-CM | POA: Diagnosis not present

## 2022-11-15 NOTE — Progress Notes (Unsigned)
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  INTERVAL HISTORY: Patient returns to clinic today for repeat laboratory work, further evaluation, and discussion of his imaging results.  He has occasional dry mouth, but otherwise feels well.  He does not complain of any pain or dysphagia today.  He has no neurologic complaints.  He denies any recent fevers or illnesses.  He has no chest pain, shortness of breath, cough, or hemoptysis.  He denies any nausea, vomiting, constipation, or diarrhea.  He has no urinary complaints.  Patient offers no further specific complaints today.  REVIEW OF SYSTEMS:   Review of Systems  Constitutional: Negative.  Negative for fever, malaise/fatigue and weight loss.  HENT:  Negative for sore throat.   Respiratory: Negative.  Negative for cough, hemoptysis and shortness of breath.   Cardiovascular: Negative.  Negative for chest pain and leg swelling.  Gastrointestinal: Negative.  Negative for abdominal pain.  Genitourinary: Negative.  Negative for dysuria.  Musculoskeletal: Negative.  Negative for back pain.  Skin: Negative.  Negative for rash.  Neurological: Negative.  Negative for dizziness, focal weakness, weakness and headaches.  Psychiatric/Behavioral: Negative.  The patient is not nervous/anxious.     As per HPI. Otherwise, a complete review of systems is negative.  PAST MEDICAL HISTORY: History reviewed. No pertinent past medical history.  PAST SURGICAL HISTORY: Past Surgical History:  Procedure Laterality Date   APPENDECTOMY  06/22/20   JOINT REPLACEMENT  02/26/2003   hip   LAPAROSCOPIC APPENDECTOMY N/A 06/23/2020   Procedure: APPENDECTOMY LAPAROSCOPIC;  Surgeon: Duanne Guess, MD;  Location: [facility] ORS;  Service: General;  Laterality:  N/A;   TOTAL HIP ARTHROPLASTY Right     FAMILY HISTORY: Family History  Problem Relation Age of Onset   Alzheimer's disease Mother    Stroke Father     ADVANCED DIRECTIVES (Y/N):  N  HEALTH MAINTENANCE: Social History   Tobacco Use   Smoking status: Every Day    Current packs/day: 0.50    Types: Cigarettes   Smokeless tobacco: Never  Vaping Use   Vaping status: Never Used  Substance Use Topics   Alcohol use: Yes    Alcohol/week: 5.0 standard drinks of alcohol    Types: 5 Shots of liquor per week   Drug use: Not Currently     Colonoscopy:  PAP:  Bone density:  Lipid panel:  No Known Allergies  Current Outpatient Medications  Medication Sig Dispense Refill   tamsulosin (FLOMAX) 0.4 MG CAPS capsule TAKE 1 CAP DAILY AFTER SUPPER FOR PROSTATE. IF NO RESPONSE IN 2-4 WKS, NOTIFY MD FOR DOSE CHANGE. 90 capsule 1   ondansetron (ZOFRAN) 8 MG tablet Take 1 tablet (8 mg total) by mouth every 8 (eight) hours as needed for nausea or vomiting. Start on the third day after cisplatin. (Patient not taking: Reported on 11/15/2022) 60 tablet 1   polyethylene glycol (MIRALAX / GLYCOLAX) 17 g packet Take 17 g by mouth daily. (Patient not taking: Reported on 11/15/2022)     prochlorperazine (COMPAZINE) 10 MG tablet Take 1 tablet (10 mg total) by mouth every 6 (six) hours as needed (Nausea or vomiting). (Patient not taking: Reported on 11/15/2022) 60 tablet 1   No current facility-administered medications for this visit.    OBJECTIVE: Vitals:   11/15/22 1043  BP:  128/83  Pulse: 77  Resp: 16  Temp: (!) 97 F (36.1 C)  SpO2: 100%      Body mass index is 19.49 kg/m.    ECOG FS:0 - Asymptomatic  General: Well-developed, well-nourished, no acute distress. Eyes: Pink conjunctiva, anicteric sclera. HEENT: Normocephalic, moist mucous membranes.  No palpable lymphadenopathy. Lungs: No audible wheezing or coughing. Heart: Regular rate and rhythm. Abdomen: Soft, nontender, no obvious  distention. Musculoskeletal: No edema, cyanosis, or clubbing. Neuro: Alert, answering all questions appropriately. Cranial nerves grossly intact. Skin: No rashes or petechiae noted. Psych: Normal affect.  LAB RESULTS:  Lab Results  Component Value Date   NA 136 11/08/2022   K 3.7 11/08/2022   CL 102 11/08/2022   CO2 25 11/08/2022   GLUCOSE 108 (H) 11/08/2022   BUN 37 (H) 11/08/2022   CREATININE 0.90 11/08/2022   CALCIUM 9.2 11/08/2022   PROT 7.1 11/08/2022   ALBUMIN 4.2 11/08/2022   AST 21 11/08/2022   ALT 19 11/08/2022   ALKPHOS 79 11/08/2022   BILITOT 0.4 11/08/2022   GFRNONAA >60 11/08/2022    Lab Results  Component Value Date   WBC 4.5 11/08/2022   NEUTROABS 2.6 11/08/2022   HGB 12.5 (L) 11/08/2022   HCT 38.2 (L) 11/08/2022   MCV 103.2 (H) 11/08/2022   PLT 195 11/08/2022     STUDIES: NM PET Image Restag (PS) Skull Base To Thigh  Result Date: 11/14/2022 CLINICAL DATA:  Subsequent treatment strategy for head neck carcinoma. EXAM: NUCLEAR MEDICINE PET SKULL BASE TO THIGH TECHNIQUE: 7.3 mCi F-18 FDG was injected intravenously. Full-ring PET imaging was performed from the skull base to thigh after the radiotracer. CT data was obtained and used for attenuation correction and anatomic localization. Fasting blood glucose: 93 mg/dl COMPARISON:  None Available. FINDINGS: Mediastinal blood pool activity: SUV max Liver activity: SUV max NA NECK: Resolution of the previously intensely hypermetabolic large RIGHT level 2 lymph node. No hypermetabolic lymph nodes remain within the neck. There is new hypermetabolic activity within the LEFT aspect of the mid tongue which is favored physiologic. Incidental CT findings: None CHEST: No hypermetabolic mediastinal or hilar nodes. No suspicious pulmonary nodules on the CT scan. Incidental CT findings: None. ABDOMEN/PELVIS: No liver metastasis.  Adrenal glands normal. Focus of metabolic activity in the RIGHT pericolic gutter with SUV max equal  5.3 on image 133. No CT correlation Incidental CT findings: Multiple bladder diverticula and enlarged prostate gland. Severe RIGHT hydronephrosis and proximal hydroureter proximally. Multiple large renal calculi SKELETON: No focal hypermetabolic activity to suggest skeletal metastasis. Incidental CT findings: None. IMPRESSION: 1. Resolution of hypermetabolic RIGHT level 2 lymph node. 2. No evidence of metastatic adenopathy in the neck. 3. New hypermetabolic activity in the LEFT aspect of the mid tongue is favored physiologic. 4. No evidence of distant metastatic disease. 5. Focus of metabolic activity in the RIGHT pericolic gutter is favored physiologic. 6. Severe RIGHT hydronephrosis and proximal hydroureter. Multiple large renal calculi. 7. Multiple bladder diverticula and enlarged prostate gland. Electronically Signed   By: Genevive Bi M.D.   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  CT does not distinctly indicate a primary lesion.  PET scan results from May 27, 2022 reviewed independently which large intensely hypermetabolic right neck mass consistent with nodal metastasis.  Patient also noted to have a right hypopharynx/piriform sinus focus which is possibly the primary lesion.  He also was noted to have indeterminate right axillary and right inguinal lymph nodes, unlikely metastasis but will monitor closely.  Patient completed his cisplatin and XRT therapy on August 12, 2022.  Recent endoscopy evaluation by ENT revealed no evidence of disease.  PET scan results from November 14, 2022 reviewed independently and reported as above with no obvious evidence of recurrent or progressive disease.  No intervention is needed at this time.  Return to clinic in 3 months with repeat laboratory work and further  evaluation.   Hydronephrosis: Appears to be secondary to renal calculi.  Patient has declined urology referral.  Creatinine continues to be within normal limits.  Will repeat CT scan of the abdomen and pelvis in 3 months for further evaluation.   Leukopenia: Resolved. Anemia: Essentially resolved.  Patient's hemoglobin is 12.5. Weight loss: Resolved.  Patient has gained nearly 10 pounds in the last 3 months.   Patient expressed understanding and was in agreement with this plan. He also understands that He can call clinic at any time with any questions, concerns, or complaints.    Cancer Staging  Squamous cell carcinoma of head and neck Staging form: Cervical Lymph Nodes and Unknown Primary Tumors of the Head and Neck, AJCC 8th Edition - Clinical: Stage IVA (cT0, cN2c, cM0) - Signed by Jeralyn Ruths, MD on 06/20/2022   Jeralyn Ruths, MD   11/17/2022 8:21 AM

## 2022-11-17 ENCOUNTER — Encounter: Payer: Self-pay | Admitting: Oncology

## 2022-11-17 ENCOUNTER — Encounter: Payer: Self-pay | Admitting: Nurse Practitioner

## 2022-11-17 ENCOUNTER — Other Ambulatory Visit: Payer: Self-pay

## 2023-01-13 ENCOUNTER — Encounter: Payer: Self-pay | Admitting: Radiation Oncology

## 2023-01-13 ENCOUNTER — Ambulatory Visit
Admission: RE | Admit: 2023-01-13 | Discharge: 2023-01-13 | Disposition: A | Discharge status: Home / Self Care | Payer: Medicare Other | Source: Ambulatory Visit | Attending: Radiation Oncology | Admitting: Radiation Oncology

## 2023-01-13 VITALS — BP 122/56 | HR 73 | Temp 96.0°F | Resp 16 | Ht 69.0 in | Wt 140.2 lb

## 2023-01-13 DIAGNOSIS — Z923 Personal history of irradiation: Secondary | ICD-10-CM | POA: Diagnosis not present

## 2023-01-13 DIAGNOSIS — R438 Other disturbances of smell and taste: Secondary | ICD-10-CM | POA: Insufficient documentation

## 2023-01-13 DIAGNOSIS — Z8589 Personal history of malignant neoplasm of other organs and systems: Secondary | ICD-10-CM | POA: Insufficient documentation

## 2023-01-13 DIAGNOSIS — C760 Malignant neoplasm of head, face and neck: Secondary | ICD-10-CM

## 2023-01-13 DIAGNOSIS — N132 Hydronephrosis with renal and ureteral calculous obstruction: Secondary | ICD-10-CM | POA: Insufficient documentation

## 2023-01-13 NOTE — Progress Notes (Signed)
Radiation Oncology Follow up Note  Name: Gary Wilkins   Date:   01/13/2023 MRN:  [id number] DOB: [birth year]    This 72 y.o. male presents to the clinic today for 69-month follow-up status post concurrent chemoradiation therapy for stage IVa squamous cell carcinoma of the head and neck.  REFERRING PROVIDER: No ref. provider found  HPI: Patient is a 72 year old male now out 5 months having completed concurrent chemoradiation therapy for squamous cell carcinoma of the head and neck stage IVa.  Seen today in routine follow-up he still having some slight dysphagia.  He also states his taste is still altered.  He continues follow-up care with Dr. Jenne Campus.  He recently had a PET CT scanBack in October showing resolution of hypermetabolic right level 2 nodes no evidence of metastatic adenopathy in the head and neck.  Does have some hypermetabolic activity left aspect of the mid tongue favoring physiologic.  No evidence of distant metastatic disease.  Does have severe right hydronephrosis and multiple large renal calculi.  COMPLICATIONS OF TREATMENT: none  FOLLOW UP COMPLIANCE: keeps appointments   PHYSICAL EXAM:  BP (!) 122/56   Pulse 73   Temp (!) 96 F (35.6 C) (Tympanic)   Resp 16   Ht 5\' 9"  (1.753 m)   Wt 140 lb 3.2 oz (63.6 kg)   BMI 20.70 kg/m  Oral cavity is clear no oral mucosal lesions are identified neck is clear without evidence of cervical adenopathy.  Well-developed well-nourished patient in NAD. HEENT reveals PERLA, EOMI, discs not visualized.  Oral cavity is clear. No oral mucosal lesions are identified. Neck is clear without evidence of cervical or supraclavicular adenopathy. Lungs are clear to A&P. Cardiac examination is essentially unremarkable with regular rate and rhythm without murmur rub or thrill. Abdomen is benign with no organomegaly or masses noted. Motor sensory and DTR levels are equal and symmetric in the upper and lower extremities. Cranial nerves II through XII are  grossly intact. Proprioception is intact. No peripheral adenopathy or edema is identified. No motor or sensory levels are noted. Crude visual fields are within normal range.  RADIOLOGY RESULTS: PET scan reviewed compatible with above-stated findings  PLAN: Present time patient is doing well with no evidence of disease by PET/CT criteria.  He continues follow-up care with ENT.  I have asked to see him back in 6 months for follow-up.  Patient knows to call with any concerns.  Of also discussed follow-up with urology about his renal calculi and made him aware that he is risking loss of a kidney based on the hydronephrosis.  Patient will follow through with urology.  I would like to take this opportunity to thank you for allowing me to participate in the care of your patient.Carmina Miller, MD

## 2023-01-14 ENCOUNTER — Other Ambulatory Visit: Payer: Self-pay

## 2023-02-04 ENCOUNTER — Other Ambulatory Visit: Payer: Self-pay

## 2023-02-04 DIAGNOSIS — N200 Calculus of kidney: Secondary | ICD-10-CM

## 2023-02-07 ENCOUNTER — Other Ambulatory Visit
Admission: RE | Admit: 2023-02-07 | Discharge: 2023-02-07 | Disposition: A | Discharge status: Home / Self Care | Payer: Medicare Other | Attending: Urology | Admitting: Urology

## 2023-02-07 ENCOUNTER — Ambulatory Visit: Payer: Medicare Other | Admitting: Urology

## 2023-02-07 VITALS — BP 124/70 | HR 65 | Ht 69.0 in | Wt 134.1 lb

## 2023-02-07 DIAGNOSIS — N131 Hydronephrosis with ureteral stricture, not elsewhere classified: Secondary | ICD-10-CM

## 2023-02-07 DIAGNOSIS — N200 Calculus of kidney: Secondary | ICD-10-CM | POA: Insufficient documentation

## 2023-02-07 DIAGNOSIS — N401 Enlarged prostate with lower urinary tract symptoms: Secondary | ICD-10-CM | POA: Diagnosis not present

## 2023-02-07 DIAGNOSIS — N138 Other obstructive and reflux uropathy: Secondary | ICD-10-CM | POA: Diagnosis not present

## 2023-02-07 DIAGNOSIS — N135 Crossing vessel and stricture of ureter without hydronephrosis: Secondary | ICD-10-CM

## 2023-02-07 LAB — URINALYSIS, COMPLETE (UACMP) WITH MICROSCOPIC
Bilirubin Urine: NEGATIVE
Glucose, UA: NEGATIVE mg/dL
Ketones, ur: NEGATIVE mg/dL
Nitrite: POSITIVE — AB
Protein, ur: 30 mg/dL — AB
Specific Gravity, Urine: 1.015 (ref 1.005–1.030)
WBC, UA: >50 WBC/hpf (ref 0–5)
pH: 6 (ref 5.0–8.0)

## 2023-02-07 MED ORDER — TAMSULOSIN HCL 0.4 MG PO CAPS: 0.4000 mg | ORAL_CAPSULE | Freq: Every day | ORAL | 3 refills | Status: DC

## 2023-02-07 MED ORDER — FINASTERIDE 5 MG PO TABS: 5.0000 mg | ORAL_TABLET | Freq: Every day | ORAL | 3 refills | Status: DC

## 2023-02-07 NOTE — Progress Notes (Signed)
02/07/2023 7:25 PM   Gary Wilkins 1950/10/24 [id number]   Chief Complaint  Patient presents with   Establish Care   renal calculi    HPI: 73 year-old male presents today for further evaluation of kidney stones.  He has a personal history of stage 4 squamous carcinoma of the head and neck and is followed by Dr. Aggie Cosier and Dr. Orlie Dakin.   He underwent a PET scan on 11/14/2022 that showed severe right hydronephrosis, and proximal hydroureter with multiple large renal calculi. He has some cortical thinning. He also has an abnormal, heavily trabeculated bladder with diverticula and prostatomegaly. There's clearly obstruction in the right kidney with delayed excretion of the material. 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  His most recent creatinine in October of 2024 was stable at 0.9.   He reports having a personal history of kidney stones back in 2006. He doesn't feel pain in his kidney but describes it as "an awareness of something there".   He mentions having some urinary frequency and is on Flomax.   Results for orders placed or performed during the hospital encounter of 02/07/23  Urinalysis, Complete w Microscopic -  Result Value Ref Range   Color, Urine YELLOW YELLOW   APPearance CLOUDY (A) CLEAR   Specific Gravity, Urine 1.015 1.005 - 1.030   pH 6.0 5.0 - 8.0   Glucose, UA NEGATIVE NEGATIVE mg/dL   Hgb urine dipstick MODERATE (A) NEGATIVE   Bilirubin Urine NEGATIVE NEGATIVE   Ketones, ur NEGATIVE NEGATIVE mg/dL   Protein, ur 30 (A) NEGATIVE mg/dL   Nitrite POSITIVE (A) NEGATIVE   Leukocytes,Ua LARGE (A) NEGATIVE   Squamous Epithelial / HPF 0-5 0 - 5 /HPF   WBC, UA >50 0 - 5 WBC/hpf   RBC / HPF 21-50 0 - 5 RBC/hpf   Bacteria, UA MANY (A) NONE SEEN   WBC Clumps PRESENT     Surgical History: Past Surgical History:   Procedure Laterality Date   APPENDECTOMY  06/22/20   JOINT REPLACEMENT  02/26/2003   hip   LAPAROSCOPIC APPENDECTOMY N/A 06/23/2020   Procedure: APPENDECTOMY LAPAROSCOPIC;  Surgeon: Duanne Guess, MD;  Location: [facility] ORS;  Service: General;  Laterality: N/A;   TOTAL HIP ARTHROPLASTY Right     Home Medications:  Allergies as of 02/07/2023   No Known Allergies      Medication List        Accurate as of February 07, 2023  7:25 PM. If you have any questions, ask your nurse or doctor.          finasteride 5 MG tablet Commonly known as: PROSCAR Take 1 tablet (5 mg total) by mouth daily.   ondansetron 8 MG tablet Commonly known as: Zofran Take 1 tablet (8 mg total) by mouth every 8 (eight) hours as needed for nausea or vomiting. Start on the third day after cisplatin.   polyethylene glycol 17 g packet Commonly known as: MIRALAX / GLYCOLAX Take 17 g by mouth daily.   prochlorperazine 10 MG tablet Commonly known as: COMPAZINE Take 1 tablet (10 mg total) by mouth every 6 (six) hours as needed (Nausea or vomiting).   tamsulosin 0.4 MG Caps capsule Commonly known as: FLOMAX Take 1 capsule (0.4 mg total) by mouth daily. What changed: See the new instructions.        Family History: Family  History  Problem Relation Age of Onset   Alzheimer's disease Mother    Stroke Father     Social History:  reports that he has been smoking cigarettes. He has never used smokeless tobacco. He reports current alcohol use of about 5.0 standard drinks of alcohol per week. He reports that he does not currently use drugs.   Physical Exam: BP 124/70   Pulse 65   Ht 5\' 9"  (1.753 m)   Wt 134 lb 2 oz (60.8 kg)   BMI 19.81 kg/m   Constitutional:  Alert and oriented, No acute distress. HEENT:  AT, moist mucus membranes.  Trachea midline, no masses. Neurologic: Grossly intact, no focal deficits, moving all 4 extremities. Psychiatric: Normal mood and affect.  Urinalysis    Component  Value Date/Time   COLORURINE YELLOW 02/07/2023 0921   APPEARANCEUR CLOUDY (A) 02/07/2023 0921   LABSPEC 1.015 02/07/2023 0921   PHURINE 6.0 02/07/2023 0921   GLUCOSEU NEGATIVE 02/07/2023 0921   HGBUR MODERATE (A) 02/07/2023 0921   BILIRUBINUR NEGATIVE 02/07/2023 0921   KETONESUR NEGATIVE 02/07/2023 0921   PROTEINUR 30 (A) 02/07/2023 0921   NITRITE POSITIVE (A) 02/07/2023 0921   LEUKOCYTESUR LARGE (A) 02/07/2023 0921    Lab Results  Component Value Date   BACTERIA MANY (A) 02/07/2023    Pertinent Imaging: Narrative & Impression  CLINICAL DATA:  Subsequent treatment strategy for head neck carcinoma.   EXAM: NUCLEAR MEDICINE PET SKULL BASE TO THIGH   TECHNIQUE: 7.3 mCi F-18 FDG was injected intravenously. Full-ring PET imaging was performed from the skull base to thigh after the radiotracer. CT data was obtained and used for attenuation correction and anatomic localization.   Fasting blood glucose: 93 mg/dl   COMPARISON:  None Available.   FINDINGS: Mediastinal blood pool activity: SUV max   Liver activity: SUV max NA   NECK: Resolution of the previously intensely hypermetabolic large RIGHT level 2 lymph node. No hypermetabolic lymph nodes remain within the neck.   There is new hypermetabolic activity within the LEFT aspect of the mid tongue which is favored physiologic.   Incidental CT findings: None   CHEST: No hypermetabolic mediastinal or hilar nodes. No suspicious pulmonary nodules on the CT scan.   Incidental CT findings: None.   ABDOMEN/PELVIS: No liver metastasis.  Adrenal glands normal.   Focus of metabolic activity in the RIGHT pericolic gutter with SUV max equal 5.3 on image 133. No CT correlation   Incidental CT findings: Multiple bladder diverticula and enlarged prostate gland. Severe RIGHT hydronephrosis and proximal hydroureter proximally. Multiple large renal calculi   SKELETON: No focal hypermetabolic activity to suggest  skeletal metastasis.   Incidental CT findings: None.   IMPRESSION: 1. Resolution of hypermetabolic RIGHT level 2 lymph node. 2. No evidence of metastatic adenopathy in the neck. 3. New hypermetabolic activity in the LEFT aspect of the mid tongue is favored physiologic. 4. No evidence of distant metastatic disease. 5. Focus of metabolic activity in the RIGHT pericolic gutter is favored physiologic. 6. Severe RIGHT hydronephrosis and proximal hydroureter. Multiple large renal calculi. 7. Multiple bladder diverticula and enlarged prostate gland.   Electronically Signed   By: Genevive Bi M.D.   On: 11/14/2022 15:24    Personally reviewed the CT component of the study and agree with radiologic interpretation.   Assessment & Plan:    1. BPH with chronic outlet obstruction  - He has an end-stage appearing bladder, but is only mildly symptomatic. He's on Flomax, which he thinks  improves his frequency. Will also optimize him on Finasteride. Prescription sent to pharmacy.   - Will defer PSA screening given his comorbidities and age.  2. 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  - Appears to be chronic and based on his history dates back to at least 2006. He's not had any issues other than awareness of his right flank. He's never had a stone episode.  - As such, I think it's reasonable to manage this conservatively. His renal function is preserved and doesn't have a history of infections. In light of his stage four head and neck cancer he agrees this is a reasonable approach. And then the plans follow up in a year.   Return in about 1 year (around 02/07/2024) for IPSS/PVR.  [facility] [address] [place], [place] [address] [phone]

## 2023-02-08 ENCOUNTER — Other Ambulatory Visit: Payer: Self-pay

## 2023-02-12 ENCOUNTER — Telehealth: Payer: Self-pay

## 2023-02-12 NOTE — Telephone Encounter (Signed)
Patient would like to know if Dr Apolinar Junes thinks he should proceed with getting CT scan done for f/u hydronephrosis that was ordered by Dr Orlie Dakin and is scheduled for February? This was ordered originally in November for 3 months re check before he was able to get in with our office.

## 2023-02-17 NOTE — Telephone Encounter (Signed)
 Patient advised.

## 2023-02-17 NOTE — Telephone Encounter (Signed)
No, I do not think he needs that CT scan.  Please do have him keep his appointment for cystoscopy however.  Vanna Scotland, MD

## 2023-03-03 ENCOUNTER — Ambulatory Visit: Payer: Commercial Managed Care - PPO

## 2023-03-10 ENCOUNTER — Inpatient Hospital Stay: Payer: Medicare Other | Attending: Oncology | Admitting: Oncology

## 2023-03-10 ENCOUNTER — Encounter: Payer: Self-pay | Admitting: Oncology

## 2023-03-10 VITALS — BP 131/58 | HR 75 | Temp 96.8°F | Resp 16 | Ht 69.0 in | Wt 138.8 lb

## 2023-03-10 DIAGNOSIS — Z923 Personal history of irradiation: Secondary | ICD-10-CM | POA: Insufficient documentation

## 2023-03-10 DIAGNOSIS — Z9221 Personal history of antineoplastic chemotherapy: Secondary | ICD-10-CM | POA: Insufficient documentation

## 2023-03-10 DIAGNOSIS — C4442 Squamous cell carcinoma of skin of scalp and neck: Secondary | ICD-10-CM | POA: Diagnosis not present

## 2023-03-10 DIAGNOSIS — Z85818 Personal history of malignant neoplasm of other sites of lip, oral cavity, and pharynx: Secondary | ICD-10-CM | POA: Diagnosis present

## 2023-03-10 DIAGNOSIS — F17210 Nicotine dependence, cigarettes, uncomplicated: Secondary | ICD-10-CM | POA: Diagnosis not present

## 2023-03-10 NOTE — Progress Notes (Signed)
 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  INTERVAL HISTORY: Patient returns to clinic today for routine 47-month evaluation.  He continues to have poor taste and dry mouth, but otherwise feels well.  He denies any pain or dysphagia.  He has no neurologic complaints.  He denies any recent fevers or illnesses.  He has no chest pain, shortness of breath, cough, or hemoptysis.  He denies any nausea, vomiting, constipation, or diarrhea.  He has no urinary complaints.  Patient offers no further specific complaints today.  REVIEW OF SYSTEMS:   Review of Systems  Constitutional: Negative.  Negative for fever, malaise/fatigue and weight loss.  HENT:  Negative for sore throat.   Respiratory: Negative.  Negative for cough, hemoptysis and shortness of breath.   Cardiovascular: Negative.  Negative for chest pain and leg swelling.  Gastrointestinal: Negative.  Negative for abdominal pain.  Genitourinary: Negative.  Negative for dysuria.  Musculoskeletal: Negative.  Negative for back pain.  Skin: Negative.  Negative for rash.  Neurological: Negative.  Negative for dizziness, focal weakness, weakness and headaches.  Psychiatric/Behavioral: Negative.  The patient is not nervous/anxious.     As per HPI. Otherwise, a complete review of systems is negative.  PAST MEDICAL HISTORY: History reviewed. No pertinent past medical history.  PAST SURGICAL HISTORY: Past Surgical History:  Procedure Laterality Date   APPENDECTOMY  06/22/20   JOINT REPLACEMENT  02/26/2003   hip   LAPAROSCOPIC APPENDECTOMY N/A 06/23/2020   Procedure: APPENDECTOMY LAPAROSCOPIC;  Surgeon: Duanne Guess, MD;  Location: [facility] ORS;  Service: General;  Laterality: N/A;   TOTAL HIP ARTHROPLASTY Right     FAMILY  HISTORY: Family History  Problem Relation Age of Onset   Alzheimer's disease Mother    Stroke Father     ADVANCED DIRECTIVES (Y/N):  N  HEALTH MAINTENANCE: Social History   Tobacco Use   Smoking status: Every Day    Current packs/day: 0.50    Types: Cigarettes   Smokeless tobacco: Never  Vaping Use   Vaping status: Never Used  Substance Use Topics   Alcohol use: Yes    Alcohol/week: 5.0 standard drinks of alcohol    Types: 5 Shots of liquor per week   Drug use: Not Currently     Colonoscopy:  PAP:  Bone density:  Lipid panel:  No Known Allergies  Current Outpatient Medications  Medication Sig Dispense Refill   finasteride (PROSCAR) 5 MG tablet Take 1 tablet (5 mg total) by mouth daily. 90 tablet 3   tamsulosin (FLOMAX) 0.4 MG CAPS capsule Take 1 capsule (0.4 mg total) by mouth daily. 90 capsule 3   ondansetron (ZOFRAN) 8 MG tablet Take 1 tablet (8 mg total) by mouth every 8 (eight) hours as needed for nausea or vomiting. Start on the third day after cisplatin. (Patient not taking: Reported on 03/10/2023) 60 tablet 1   polyethylene glycol (MIRALAX / GLYCOLAX) 17 g packet Take 17 g by mouth daily. (Patient not taking: Reported on 03/10/2023)     prochlorperazine (COMPAZINE) 10 MG tablet Take 1 tablet (10 mg total) by mouth every 6 (six) hours as needed (Nausea or vomiting). (Patient not taking: Reported on 03/10/2023) 60 tablet 1   No current facility-administered medications for this visit.    OBJECTIVE: Vitals:   03/10/23 1053  BP: (!) 131/58  Pulse: 75  Resp: 16  Temp: (!) 96.8 F (36 C)  SpO2: 100%      Body mass index is 20.5 kg/m.    ECOG FS:0 - Asymptomatic  General: Well-developed, well-nourished, no acute distress. Eyes: Pink conjunctiva, anicteric sclera. HEENT: Normocephalic, moist mucous membranes.  No palpable lymphadenopathy. Lungs: No audible wheezing or coughing. Heart: Regular rate and rhythm. Abdomen: Soft, nontender, no obvious  distention. Musculoskeletal: No edema, cyanosis, or clubbing. Neuro: Alert, answering all questions appropriately. Cranial nerves grossly intact. Skin: No rashes or petechiae noted. Psych: Normal affect.   LAB RESULTS:  Lab Results  Component Value Date   NA 136 11/08/2022   K 3.7 11/08/2022   CL 102 11/08/2022   CO2 25 11/08/2022   GLUCOSE 108 (H) 11/08/2022   BUN 37 (H) 11/08/2022   CREATININE 0.90 11/08/2022   CALCIUM 9.2 11/08/2022   PROT 7.1 11/08/2022   ALBUMIN 4.2 11/08/2022   AST 21 11/08/2022   ALT 19 11/08/2022   ALKPHOS 79 11/08/2022   BILITOT 0.4 11/08/2022   GFRNONAA >60 11/08/2022    Lab Results  Component Value Date   WBC 4.5 11/08/2022   NEUTROABS 2.6 11/08/2022   HGB 12.5 (L) 11/08/2022   HCT 38.2 (L) 11/08/2022   MCV 103.2 (H) 11/08/2022   PLT 195 11/08/2022     STUDIES: No results found.  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  His most recent PET scan results from November 14, 2022 reviewed independently with no obvious evidence of recurrent or progressive disease.  No further imaging is necessary unless there is suspicion of recurrence. Recent endoscopy evaluation by ENT revealed no evidence of disease.  No intervention is needed at this time.  Patient has been instructed to keep his ENT follow-up appointments as scheduled.  Return to clinic in 6 months with repeat laboratory work and further evaluation. Hydronephrosis: Continue follow-up with urology as indicated.  Patient has declined any further abdominal or pelvic imaging.   Patient expressed understanding and was in agreement with this plan. He also understands that He can call clinic at any time with any questions, concerns, or complaints.    Cancer Staging  Squamous cell carcinoma of head and neck Staging form: Cervical Lymph Nodes and  Unknown Primary Tumors of the Head and Neck, AJCC 8th Edition - Clinical: Stage IVA (cT0, cN2c, cM0) - Signed by Jeralyn Ruths, MD on 06/20/2022   Jeralyn Ruths, MD   03/10/2023 11:07 AM

## 2023-03-26 ENCOUNTER — Other Ambulatory Visit: Payer: Self-pay

## 2023-03-26 DIAGNOSIS — N135 Crossing vessel and stricture of ureter without hydronephrosis: Secondary | ICD-10-CM

## 2023-03-26 DIAGNOSIS — N401 Enlarged prostate with lower urinary tract symptoms: Secondary | ICD-10-CM

## 2023-03-28 ENCOUNTER — Other Ambulatory Visit
Admission: RE | Admit: 2023-03-28 | Discharge: 2023-03-28 | Disposition: A | Discharge status: Home / Self Care | Attending: Urology | Admitting: Urology

## 2023-03-28 ENCOUNTER — Ambulatory Visit (INDEPENDENT_AMBULATORY_CARE_PROVIDER_SITE_OTHER): Payer: Commercial Managed Care - PPO | Admitting: Urology

## 2023-03-28 VITALS — BP 127/72 | HR 76 | Ht 69.0 in | Wt 134.0 lb

## 2023-03-28 DIAGNOSIS — N135 Crossing vessel and stricture of ureter without hydronephrosis: Secondary | ICD-10-CM | POA: Diagnosis present

## 2023-03-28 DIAGNOSIS — R3129 Other microscopic hematuria: Secondary | ICD-10-CM | POA: Diagnosis not present

## 2023-03-28 DIAGNOSIS — N401 Enlarged prostate with lower urinary tract symptoms: Secondary | ICD-10-CM | POA: Diagnosis present

## 2023-03-28 LAB — URINALYSIS, COMPLETE (UACMP) WITH MICROSCOPIC
Bilirubin Urine: NEGATIVE
Glucose, UA: NEGATIVE mg/dL
Ketones, ur: NEGATIVE mg/dL
Nitrite: POSITIVE — AB
Protein, ur: 30 mg/dL — AB
Specific Gravity, Urine: 1.015 (ref 1.005–1.030)
WBC, UA: >50 WBC/hpf (ref 0–5)
pH: 6 (ref 5.0–8.0)

## 2023-04-02 NOTE — Progress Notes (Signed)
   03/28/23  CC:  Chief Complaint  Patient presents with   Cysto    HPI: 73 year old male with BPH with chronic outlet obstruction, chronic right hydronephrosis who presents today for cystoscopy in the setting of microscopic hematuria and risk factors including smoking.  He did have a fairly recent PET scan that showed no concern for GU malignancy.  Blood pressure 127/72, pulse 76, height 5\' 9"  (1.753 m), weight 134 lb (60.8 kg). NED. A&Ox3.   No respiratory distress   Abd soft, NT, ND Normal phallus with bilateral descended testicles  Cystoscopy Procedure Note  Patient identification was confirmed, informed consent was obtained, and patient was prepped using Betadine solution.  Lidocaine jelly was administered per urethral meatus.     Pre-Procedure: - Inspection reveals a normal caliber ureteral meatus.  Procedure: The flexible cystoscope was introduced without difficulty - No urethral strictures/lesions are present. - Enlarged prostate  - Elevated bladder neck -Bladder mucosa itself was normal without tumors masses or lesions.  No appreciable bladder stones.  Bladder was heavily trabeculated with multiple saccules and diverticula, some of which were relatively widemouth others quite narrow.  I do feel like I was able to survey most if not all of the mucosa bioengineering diverticula.  Retroflexion shows intravesical protrusion of the prostate   Post-Procedure: - Patient tolerated the procedure well  Assessment/ Plan:  1. Microscopic hematuria (Primary) Cystoscopy today was unremarkable.  Microscopic blood may be related to chronic stones.  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  Follow-up as previously scheduled.    Vanna Scotland, MD

## 2023-07-23 ENCOUNTER — Ambulatory Visit
Admission: RE | Admit: 2023-07-23 | Discharge: 2023-07-23 | Disposition: A | Discharge status: Home / Self Care | Payer: Commercial Managed Care - PPO | Source: Ambulatory Visit | Attending: Radiation Oncology | Admitting: Radiation Oncology

## 2023-07-23 ENCOUNTER — Encounter: Payer: Self-pay | Admitting: Radiation Oncology

## 2023-07-23 VITALS — BP 108/72 | HR 73 | Temp 96.8°F | Resp 14 | Ht 69.0 in | Wt 145.7 lb

## 2023-07-23 DIAGNOSIS — Z8589 Personal history of malignant neoplasm of other organs and systems: Secondary | ICD-10-CM | POA: Diagnosis present

## 2023-07-23 DIAGNOSIS — C760 Malignant neoplasm of head, face and neck: Secondary | ICD-10-CM

## 2023-07-23 DIAGNOSIS — Z9221 Personal history of antineoplastic chemotherapy: Secondary | ICD-10-CM | POA: Insufficient documentation

## 2023-07-23 DIAGNOSIS — Z923 Personal history of irradiation: Secondary | ICD-10-CM | POA: Insufficient documentation

## 2023-07-23 NOTE — Progress Notes (Signed)
 Radiation Oncology Follow up Note  Name: Gary Wilkins   Date:   07/23/2023 MRN:  [id number] DOB: 12/26/1950    This 73 y.o. male presents to the clinic today for 1 year follow-up status post concurrent chemoradiation therapy for stage IVa squamous cell carcinoma of the head and neck.  REFERRING PROVIDER: No ref. provider found  HPI: Patient is a 73 year old male now out 1 year having completed concurrent chemoradiation therapy for squamous cell carcinoma the head and neck stage IVa.  Seen today in routine follow-up he is doing well.  Still has some alteration of taste.  Specifically denies any head and neck pain or dysphagia he is under continuous surveillance by ENT showing no evidence of disease on upper endoscopy..  He had a PET CT scan back in October which I have reviewed showed total resolution of hypermetabolic level 2 lymph nodes no evidence of metastatic disease.  Any other areas of hypermetabolic activity in the head and neck were thought to be physiologic.  COMPLICATIONS OF TREATMENT: none  FOLLOW UP COMPLIANCE: keeps appointments   PHYSICAL EXAM:  BP 108/72   Pulse 73   Temp (!) 96.8 F (36 C) (Tympanic)   Resp 14   Ht 5' 9 (1.753 m)   Wt 145 lb 11.2 oz (66.1 kg)   BMI 21.52 kg/m  Oral cavity is clear no oral mucosal lesions are identified neck is clear without evidence of adenopathy.  Well-developed well-nourished patient in NAD. HEENT reveals PERLA, EOMI, discs not visualized.  Oral cavity is clear. No oral mucosal lesions are identified. Neck is clear without evidence of cervical or supraclavicular adenopathy. Lungs are clear to A&P. Cardiac examination is essentially unremarkable with regular rate and rhythm without murmur rub or thrill. Abdomen is benign with no organomegaly or masses noted. Motor sensory and DTR levels are equal and symmetric in the upper and lower extremities. Cranial nerves II through XII are grossly intact. Proprioception is intact. No peripheral  adenopathy or edema is identified. No motor or sensory levels are noted. Crude visual fields are within normal range.  RADIOLOGY RESULTS: PET CT scan reviewed compatible with above-stated findings.  PLAN: Present time patient is now out a year from concurrent chemoradiation therapy for stage IVa squamous cell carcinoma the head and neck region.  I am going to turn follow-up care over to medical oncology as well as ENT since they are performing routine upper endoscopies.  I would be happy to reevaluate the patient in time the future should that be indicated.  Patient knows to call with any concerns.  I would like to take this opportunity to thank you for allowing me to participate in the care of your patient.SABRA Marcey Penton, MD

## 2023-08-28 ENCOUNTER — Encounter: Payer: Self-pay | Admitting: Urology

## 2023-09-08 ENCOUNTER — Encounter: Payer: Self-pay | Admitting: Oncology

## 2023-09-08 ENCOUNTER — Inpatient Hospital Stay: Payer: Commercial Managed Care - PPO

## 2023-09-08 ENCOUNTER — Inpatient Hospital Stay: Payer: Commercial Managed Care - PPO | Attending: Oncology | Admitting: Oncology

## 2023-09-08 VITALS — BP 136/62 | HR 74 | Temp 97.8°F | Resp 18 | Ht 69.0 in | Wt 147.0 lb

## 2023-09-08 DIAGNOSIS — F17210 Nicotine dependence, cigarettes, uncomplicated: Secondary | ICD-10-CM | POA: Insufficient documentation

## 2023-09-08 DIAGNOSIS — C4442 Squamous cell carcinoma of skin of scalp and neck: Secondary | ICD-10-CM

## 2023-09-08 DIAGNOSIS — Z8589 Personal history of malignant neoplasm of other organs and systems: Secondary | ICD-10-CM | POA: Diagnosis present

## 2023-09-08 LAB — CMP (CANCER CENTER ONLY)
ALT: 26 U/L (ref 0–44)
AST: 26 U/L (ref 15–41)
Albumin: 3.8 g/dL (ref 3.5–5.0)
Alkaline Phosphatase: 113 U/L (ref 38–126)
Anion gap: 10 (ref 5–15)
BUN: 34 mg/dL — ABNORMAL HIGH (ref 8–23)
CO2: 22 mmol/L (ref 22–32)
Calcium: 9.3 mg/dL (ref 8.9–10.3)
Chloride: 103 mmol/L (ref 98–111)
Creatinine: 0.95 mg/dL (ref 0.61–1.24)
GFR, Estimated: >60 mL/min (ref >60)
Glucose, Bld: 113 mg/dL — ABNORMAL HIGH (ref 70–99)
Potassium: 4.4 mmol/L (ref 3.5–5.1)
Sodium: 135 mmol/L (ref 135–145)
Total Bilirubin: 0.7 mg/dL (ref 0.0–1.2)
Total Protein: 7.2 g/dL (ref 6.5–8.1)

## 2023-09-08 LAB — CBC WITH DIFFERENTIAL/PLATELET
Abs Immature Granulocytes: 0.04 K/uL (ref 0.00–0.07)
Basophils Absolute: 0.1 K/uL (ref 0.0–0.1)
Basophils Relative: 1 %
Eosinophils Absolute: 0.1 K/uL (ref 0.0–0.5)
Eosinophils Relative: 3 %
HCT: 40.8 % (ref 39.0–52.0)
Hemoglobin: 13.6 g/dL (ref 13.0–17.0)
Immature Granulocytes: 1 %
Lymphocytes Relative: 19 %
Lymphs Abs: 1 K/uL (ref 0.7–4.0)
MCH: 32.3 pg (ref 26.0–34.0)
MCHC: 33.3 g/dL (ref 30.0–36.0)
MCV: 96.9 fL (ref 80.0–100.0)
Monocytes Absolute: 0.8 K/uL (ref 0.1–1.0)
Monocytes Relative: 16 %
Neutro Abs: 3.1 K/uL (ref 1.7–7.7)
Neutrophils Relative %: 60 %
Platelets: 239 K/uL (ref 150–400)
RBC: 4.21 MIL/uL — ABNORMAL LOW (ref 4.22–5.81)
RDW: 14 % (ref 11.5–15.5)
WBC: 5.2 K/uL (ref 4.0–10.5)
nRBC: 0 % (ref 0.0–0.2)

## 2023-09-08 NOTE — Progress Notes (Unsigned)
 Patient does have a couple of questions for the doctor today.

## 2023-09-08 NOTE — Progress Notes (Signed)
 Survivorship Care Plan visit completed.  Treatment summary reviewed and given to patient.  ASCO answers booklet reviewed and given to patient.  CARE program and Cancer Transitions discussed with patient along with other resources cancer center offers to patients and caregivers.  Patient verbalized understanding.

## 2023-09-08 NOTE — Progress Notes (Unsigned)
 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  INTERVAL HISTORY: Patient returns to clinic today for routine 52-month evaluation.  He currently feels well and is asymptomatic.  He continues to have occasional dry mouth, but states this is improving.  He denies any pain or dysphagia.  He has a good appetite and denies weight loss.  He has no neurologic complaints.  He denies any recent fevers or illnesses.  He has no chest pain, shortness of breath, cough, or hemoptysis.  He denies any nausea, vomiting, constipation, or diarrhea.  He has no urinary complaints.  Patient offers no further specific complaints today.  REVIEW OF SYSTEMS:   Review of Systems  Constitutional: Negative.  Negative for fever, malaise/fatigue and weight loss.  HENT:  Negative for sore throat.   Respiratory: Negative.  Negative for cough, hemoptysis and shortness of breath.   Cardiovascular: Negative.  Negative for chest pain and leg swelling.  Gastrointestinal: Negative.  Negative for abdominal pain.  Genitourinary: Negative.  Negative for dysuria.  Musculoskeletal: Negative.  Negative for back pain.  Skin: Negative.  Negative for rash.  Neurological: Negative.  Negative for dizziness, focal weakness, weakness and headaches.  Psychiatric/Behavioral: Negative.  The patient is not nervous/anxious.     As per HPI. Otherwise, a complete review of systems is negative.  PAST MEDICAL HISTORY: History reviewed. No pertinent past medical history.  PAST SURGICAL HISTORY: Past Surgical History:  Procedure Laterality Date    APPENDECTOMY  06/22/20   JOINT REPLACEMENT  02/26/2003   hip   LAPAROSCOPIC APPENDECTOMY N/A 06/23/2020   Procedure: APPENDECTOMY LAPAROSCOPIC;  Surgeon: Marolyn Nest, MD;  Location: [facility] ORS;  Service: General;  Laterality: N/A;   TOTAL HIP ARTHROPLASTY Right     FAMILY HISTORY: Family History  Problem Relation Age of Onset   Alzheimer's disease Mother    Stroke Father     ADVANCED DIRECTIVES (Y/N):  N  HEALTH MAINTENANCE: Social History   Tobacco Use   Smoking status: Every Day    Current packs/day: 0.50    Types: Cigarettes   Smokeless tobacco: Never  Vaping Use   Vaping status: Never Used  Substance Use Topics   Alcohol use: Yes    Alcohol/week: 5.0 standard drinks of alcohol    Types: 5 Shots of liquor per week   Drug use: Not Currently     Colonoscopy:  PAP:  Bone density:  Lipid panel:  No Known Allergies  Current Outpatient Medications  Medication Sig Dispense Refill   finasteride  (PROSCAR ) 5 MG tablet Take 1 tablet (5 mg total) by mouth daily. 90 tablet 3   polyethylene glycol (MIRALAX / GLYCOLAX) 17 g packet Take 17 g by mouth daily.     tamsulosin  (FLOMAX ) 0.4 MG CAPS capsule Take 1 capsule (0.4 mg total) by mouth daily. 90 capsule 3   No current facility-administered medications for this visit.    OBJECTIVE: Vitals:   09/08/23 1055  BP: 136/62  Pulse: 74  Resp: 18  Temp: 97.8 F (36.6 C)  SpO2: 100%      Body mass index is 21.71 kg/m.    ECOG FS:0 - Asymptomatic  General: Well-developed, well-nourished, no  acute distress. Eyes: Pink conjunctiva, anicteric sclera. HEENT: Normocephalic, moist mucous membranes. Lungs: No audible wheezing or coughing. Heart: Regular rate and rhythm. Abdomen: Soft, nontender, no obvious distention. Musculoskeletal: No edema, cyanosis, or clubbing. Neuro: Alert, answering all questions appropriately. Cranial nerves grossly intact. Skin: No rashes or petechiae noted. Psych: Normal affect.  LAB  RESULTS:  Lab Results  Component Value Date   NA 135 09/08/2023   K 4.4 09/08/2023   CL 103 09/08/2023   CO2 22 09/08/2023   GLUCOSE 113 (H) 09/08/2023   BUN 34 (H) 09/08/2023   CREATININE 0.95 09/08/2023   CALCIUM 9.3 09/08/2023   PROT 7.2 09/08/2023   ALBUMIN 3.8 09/08/2023   AST 26 09/08/2023   ALT 26 09/08/2023   ALKPHOS 113 09/08/2023   BILITOT 0.7 09/08/2023   GFRNONAA >60 09/08/2023    Lab Results  Component Value Date   WBC 5.2 09/08/2023   NEUTROABS 3.1 09/08/2023   HGB 13.6 09/08/2023   HCT 40.8 09/08/2023   MCV 96.9 09/08/2023   PLT 239 09/08/2023     STUDIES: No results found.  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  PET scan results from November 14, 2022 reviewed independently with no obvious evidence of recurrent or progressive disease.  No further imaging is necessary unless there is suspicion of recurrence.  Patient's last endoscopy by ENT in December 2024 revealed no evidence of disease.  He reports he has an appointment with them in the next 1 to 2 months.  No intervention is needed at this time.  Return to clinic in 6 months and evaluation.  History of hydronephrosis: Continue follow-up with urology as indicated.  Patient has previously declined any further abdominal or pelvic imaging.  I spent a total of 20 minutes reviewing chart data, face-to-face evaluation with the patient, counseling and coordination of care as detailed above.    Patient expressed understanding and was in agreement with this plan. He also understands that He can call clinic at any time with any questions, concerns, or complaints.    Cancer Staging  Squamous cell carcinoma of head and neck Staging form: Cervical Lymph Nodes and Unknown Primary Tumors of the Head and Neck, AJCC 8th Edition - Clinical: Stage IVA (cT0, cN2c,  cM0) - Signed by Jacobo Evalene PARAS, MD on 06/20/2022   Evalene PARAS Jacobo, MD   09/09/2023 7:33 AM

## 2023-09-09 ENCOUNTER — Other Ambulatory Visit: Payer: Self-pay

## 2023-09-09 ENCOUNTER — Encounter: Payer: Self-pay | Admitting: Nurse Practitioner

## 2023-09-09 ENCOUNTER — Encounter: Payer: Self-pay | Admitting: Oncology

## 2023-10-31 ENCOUNTER — Ambulatory Visit
Admission: EM | Admit: 2023-10-31 | Discharge: 2023-10-31 | Disposition: A | Discharge status: Home / Self Care | Attending: Emergency Medicine | Admitting: Emergency Medicine

## 2023-10-31 DIAGNOSIS — L300 Nummular dermatitis: Secondary | ICD-10-CM

## 2023-10-31 DIAGNOSIS — L03116 Cellulitis of left lower limb: Secondary | ICD-10-CM | POA: Diagnosis not present

## 2023-10-31 MED ORDER — CEPHALEXIN 500 MG PO CAPS: 500.0000 mg | ORAL_CAPSULE | Freq: Three times a day (TID) | ORAL | 0 refills | Status: AC

## 2023-10-31 MED ORDER — TRIAMCINOLONE ACETONIDE 0.5 % EX OINT: 1.0000 | TOPICAL_OINTMENT | Freq: Two times a day (BID) | CUTANEOUS | 0 refills | Status: DC

## 2023-10-31 NOTE — ED Triage Notes (Signed)
 Pt c/o left foot rash and drainage x1week  Pt states that he has eczema  Pt states that his foot scabs over and then starts to drain, causing the scab to fall off, and then it repeats  Pt denies any new skin lotions or out door activities.   Pt reports more eczema outbreaks on hands and hips.   Pt has been using antifungal cream for the outbreaks.

## 2023-10-31 NOTE — Discharge Instructions (Addendum)
 Take the 500 mg of Keflex 3 times a day with food for 7 days to treat the cellulitis from your eczema flare.  You may apply the triamcinolone 0.5% ointment to the rash on your hands and feet twice daily to help decrease inflammation and help resolve the scaliness.  You may also use emollients such as Eucerin, CeraVe a, Cetaphil, or bag balm to help moisturize the skin.  If you use the bag balm I would recommend that you apply it at bedtime and then put on a pair of old socks and allow the ointment to the absorb by the skin to help with the flakiness and return moisture to your skin.  If there is any increased redness, swelling, pus drainage from the rash, red streaks going up your leg, or fevers you need to return for reevaluation.

## 2023-10-31 NOTE — ED Provider Notes (Signed)
 [facility]    CSN: [id number] Arrival date & time: 10/31/23  1321      History   Chief Complaint Chief Complaint  Patient presents with   Foot Problem    HPI Gary Wilkins is a 73 y.o. male.   HPI  73 year old male with past medical history significant for eczema presents for evaluation of a rash on his left ankle that has been present for approximately 1 week.  He does have a history of eczema and uses over-the-counter eczema cream but nothing prescription.  He reports that the skin will become very dry and flaky, flake off, foods and then scab over before repeating the cycle.  He denies any fever.  History reviewed. No pertinent past medical history.  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 Authorizing Provider  cephALEXin (KEFLEX) 500 MG capsule Take 1 capsule (500 mg total) by mouth 3 (three) times daily for 7 days. 10/31/23 11/07/23 Yes Bernardino Ditch, NP  finasteride  (PROSCAR ) 5 MG tablet Take 1 tablet (5 mg total) by mouth daily. 02/07/23  Yes Penne Knee, MD  tamsulosin  (FLOMAX ) 0.4 MG CAPS capsule Take 1 capsule (0.4 mg total) by mouth daily. 02/07/23  Yes Penne Knee, MD  triamcinolone ointment (KENALOG) 0.5 % Apply 1 Application topically 2 (two) times daily. 10/31/23  Yes Bernardino Ditch, NP    Family History Family History  Problem Relation Age of Onset   Alzheimer's disease Mother    Stroke Father     Social  History Social History   Tobacco Use   Smoking status: Every Day    Current packs/day: 0.50    Types: Cigarettes   Smokeless tobacco: Never  Vaping Use   Vaping status: Never Used  Substance Use Topics   Alcohol use: Yes    Alcohol/week: 5.0 standard drinks of alcohol    Types: 5 Shots of liquor per week   Drug use: Not Currently     Allergies   Patient has no known allergies.   Review of Systems Review of Systems  Constitutional:  Negative for fever.  Skin:  Positive for color change and rash.     Physical Exam Triage Vital Signs ED Triage Vitals  Encounter Vitals Group     BP      Girls Systolic BP Percentile      Girls Diastolic BP Percentile      Boys Systolic BP Percentile      Boys Diastolic BP Percentile      Pulse      Resp      Temp      Temp src      SpO2      Weight      Height      Head Circumference      Peak Flow      Pain Score      Pain Loc      Pain Education  Exclude from Growth Chart    No data found.  Updated Vital Signs BP 139/82 (BP Location: Left Arm)   Pulse 86   Temp 97.9 F (36.6 C) (Oral)   Resp 16   Wt 141 lb 3.2 oz (64 kg)   SpO2 98%   BMI 20.85 kg/m   Visual Acuity Right Eye Distance:   Left Eye Distance:   Bilateral Distance:    Right Eye Near:   Left Eye Near:    Bilateral Near:     Physical Exam Vitals and nursing note reviewed.  Constitutional:      Appearance: Normal appearance. He is not ill-appearing.  HENT:     Head: Normocephalic and atraumatic.  Musculoskeletal:        General: Tenderness present. No signs of injury.  Skin:    General: Skin is warm and dry.     Capillary Refill: Capillary refill takes less than 2 seconds.     Findings: Erythema and rash present.  Neurological:     General: No focal deficit present.     Mental Status: He is alert and oriented to person, place, and time.      UC Treatments / Results  Labs (all labs ordered are listed, but only abnormal results are  displayed) Labs Reviewed - No data to display  EKG   Radiology No results found.  Procedures Procedures (including critical care time)  Medications Ordered in UC Medications - No data to display  Initial Impression / Assessment and Plan / UC Course  I have reviewed the triage vital signs and the nursing notes.  Pertinent labs & imaging results that were available during my care of the patient were reviewed by me and considered in my medical decision making (see chart for details).   Patient is a pleasant, nontoxic-appearing 73 year old male presenting for evaluation of skin rash as outlined in HPI above.  As you can see in image above, the patient also has lesions on his fingers which are more coin shaped and represent nummular eczema.  The lesion on the patient's leg is more concerning.  He does have edema to his midfoot but his foot is warm and his DP and PT pulses are 2+.  He has this dry flaking skin over the anterior aspect of his ankle and erythema extending vertically up to the level of the tibial tuberosity.  The area of erythema is hot to touch but blanchable.  There is no apparent pustular drainage.  I suspect the patient has developed a secondary cellulitis as a result of his eczema flare.  I will start him on Keflex 500 g 3 times daily for 7 days for control of the cellulitis and prescribed triamcinolone 0.5% ointment that he can apply to the itchy scaly rash twice daily to help with the inflammation.  Return precautions reviewed.   Final Clinical Impressions(s) / UC Diagnoses   Final diagnoses:  Cellulitis of left lower extremity  Nummular eczema     Discharge Instructions      Take the 500 mg of Keflex 3 times a day with food for 7 days to treat the cellulitis from your eczema flare.  You may apply the triamcinolone 0.5% ointment to the rash on your hands and feet twice daily to help decrease inflammation and help resolve the scaliness.  You may also use  emollients such as Eucerin, CeraVe a, Cetaphil, or bag balm to help moisturize the skin.  If you use the bag balm I would recommend  that you apply it at bedtime and then put on a pair of old socks and allow the ointment to the absorb by the skin to help with the flakiness and return moisture to your skin.  If there is any increased redness, swelling, pus drainage from the rash, red streaks going up your leg, or fevers you need to return for reevaluation.     ED Prescriptions     Medication Sig Dispense Auth. Provider   cephALEXin (KEFLEX) 500 MG capsule Take 1 capsule (500 mg total) by mouth 3 (three) times daily for 7 days. 21 capsule Bernardino Ditch, NP   triamcinolone ointment (KENALOG) 0.5 % Apply 1 Application topically 2 (two) times daily. 30 g Bernardino Ditch, NP      PDMP not reviewed this encounter.   Bernardino Ditch, NP 10/31/23 1506

## 2024-01-29 DIAGNOSIS — N400 Enlarged prostate without lower urinary tract symptoms: Secondary | ICD-10-CM | POA: Insufficient documentation

## 2024-01-29 DIAGNOSIS — N1330 Unspecified hydronephrosis: Secondary | ICD-10-CM | POA: Insufficient documentation

## 2024-01-29 NOTE — Assessment & Plan Note (Addendum)
 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  Similarly, he remains asymptomatic, no flank pain, no stone events.  At this point, I would favor expectant management which he also prefers.  He is not interested in any preemptive or aggressive surgical workup or treatment.  I think we may continue to follow with imaging as indicated or if new clinical symptoms (new right flank pain, acute stone obstruction, upper tract deterioration).   - Expectant management for now.  May consider interval imaging as clinically indicated or if new symptoms

## 2024-01-29 NOTE — Progress Notes (Signed)
 "  02/04/2024 8:49 AM   Gary Wilkins 08/23/1950 [id number]  Reason for visit: Follow up BPH, Right hydro, nephrolithiasis   HPI: 74 y.o. male, initial follow up with me today, previously seen by Dr. 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  Laboratory Data:  Latest Reference Range & Units 09/08/23 10:16  Creatinine 0.61 - 1.24 mg/dL 9.04    Pertinent Imaging: I personally reviewed his PET scan from October 2024, primarily evaluating GU anatomy.  Appears to have chronic right hydronephrosis with nonobstructive renal stones measuring up to 15 mm right lower pole.  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  Contralateral left kidney is morphologically normal, no hydro, no stones.  Bladder shows moderate to severe diverticula. ~70-80g prostate with apparent intravesical median lobe.     Assessment & Plan:    Benign prostatic hyperplasia with lower urinary tract symptoms, symptom details unspecified Assessment & Plan: Longstanding BPH/BOO  - end-stage bladder decompensation  - intravesical  median lobe, heavy trabeculations/saccules, ~70-80g gland  - on Flomax /finasteride   PVR 204cc today  Reviewed his clinical history with him today.  Radiographically, he appears to have an enlarged prostate with a decompensating bladder.  Although symptomatically he is generally content with mild to moderate symptoms at worst.  Reasonable to continue expectant management with medical therapy only, provided no frank urinary retention, hospitalizations, urinary tract infections, upper tract renal deterioration.  For completeness, I did review surgical outlet procedures-although difficult to say if this would improve his current quality of life or reverse any meaningful degree of bladder function.  I be happy to refer him to one of my partners who performs HoLEPs. Ultimately, he preferred to continue current management.  - Refill Flomax  and finasteride  today, continue current combo therapy - RTC in 1 year for symptom check, PVR, IPSS-sooner if new issues arise  Orders: -     Bladder Scan (Post Void Residual) in office -     Finasteride ; Take 1 tablet (5 mg total) by mouth daily.  Dispense: 90 tablet; Refill: 3 -     Tamsulosin  HCl; Take 1 capsule (0.4 mg total) by mouth daily.  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  Similarly, he remains asymptomatic, no flank pain, no stone  events.  At this point, I would favor expectant management which he also prefers.  He is not interested in any preemptive or aggressive surgical workup or treatment.  I think we may continue to follow with imaging as indicated or if new clinical symptoms (new right flank pain, acute stone obstruction, upper tract deterioration).   -  Expectant management for now.  May consider interval imaging as clinically indicated or if new symptoms        Penne JONELLE Skye, MD  [facility] [address], [address] [place], [place] [address] [phone] "

## 2024-01-29 NOTE — Assessment & Plan Note (Addendum)
 Longstanding BPH/BOO  - end-stage bladder decompensation  - intravesical median lobe, heavy trabeculations/saccules, ~70-80g gland  - on Flomax /finasteride   PVR 204cc today  Reviewed his clinical history with him today.  Radiographically, he appears to have an enlarged prostate with a decompensating bladder.  Although symptomatically he is generally content with mild to moderate symptoms at worst.  Reasonable to continue expectant management with medical therapy only, provided no frank urinary retention, hospitalizations, urinary tract infections, upper tract renal deterioration.  For completeness, I did review surgical outlet procedures-although difficult to say if this would improve his current quality of life or reverse any meaningful degree of bladder function.  I be happy to refer him to one of my partners who performs HoLEPs. Ultimately, he preferred to continue current management.  - Refill Flomax  and finasteride  today, continue current combo therapy - RTC in 1 year for symptom check, PVR, IPSS-sooner if new issues arise

## 2024-02-04 ENCOUNTER — Ambulatory Visit (INDEPENDENT_AMBULATORY_CARE_PROVIDER_SITE_OTHER): Admitting: Urology

## 2024-02-04 VITALS — BP 121/70 | HR 81 | Ht 69.0 in | Wt 142.0 lb

## 2024-02-04 DIAGNOSIS — N1330 Unspecified hydronephrosis: Secondary | ICD-10-CM

## 2024-02-04 DIAGNOSIS — N401 Enlarged prostate with lower urinary tract symptoms: Secondary | ICD-10-CM | POA: Diagnosis not present

## 2024-02-04 LAB — BLADDER SCAN AMB NON-IMAGING: Scan Result: 204

## 2024-02-04 MED ORDER — FINASTERIDE 5 MG PO TABS: 5.0000 mg | ORAL_TABLET | Freq: Every day | ORAL | 3 refills | Status: AC

## 2024-02-04 MED ORDER — TAMSULOSIN HCL 0.4 MG PO CAPS: 0.4000 mg | ORAL_CAPSULE | Freq: Every day | ORAL | 3 refills | Status: AC

## 2024-02-05 ENCOUNTER — Other Ambulatory Visit: Payer: Self-pay

## 2024-02-06 ENCOUNTER — Ambulatory Visit: Payer: Self-pay | Admitting: Urology

## 2024-03-10 ENCOUNTER — Ambulatory Visit: Admitting: Oncology

## 2025-02-02 ENCOUNTER — Ambulatory Visit: Admitting: Urology
